# Patient Record
Sex: Male | Born: 1945 | Race: White | Hispanic: No | Marital: Married | Smoking: Never smoker
Health system: Southern US, Community
[De-identification: ages and names within clinical notes are randomized; demographics above are authoritative.]

## PROBLEM LIST (undated history)

## (undated) DIAGNOSIS — I10 Essential (primary) hypertension: Secondary | ICD-10-CM

## (undated) DIAGNOSIS — T753XXA Motion sickness, initial encounter: Secondary | ICD-10-CM

## (undated) HISTORY — DX: Essential (primary) hypertension: I10

---

## 2016-09-26 DIAGNOSIS — H2513 Age-related nuclear cataract, bilateral: Secondary | ICD-10-CM | POA: Diagnosis not present

## 2018-12-04 DIAGNOSIS — Z23 Encounter for immunization: Secondary | ICD-10-CM | POA: Diagnosis not present

## 2019-07-23 DIAGNOSIS — Z7189 Other specified counseling: Secondary | ICD-10-CM | POA: Diagnosis not present

## 2019-07-23 DIAGNOSIS — Z23 Encounter for immunization: Secondary | ICD-10-CM | POA: Diagnosis not present

## 2019-08-20 DIAGNOSIS — Z23 Encounter for immunization: Secondary | ICD-10-CM | POA: Diagnosis not present

## 2019-08-20 DIAGNOSIS — Z7189 Other specified counseling: Secondary | ICD-10-CM | POA: Diagnosis not present

## 2019-12-02 ENCOUNTER — Encounter: Payer: Self-pay | Admitting: Emergency Medicine

## 2019-12-02 ENCOUNTER — Ambulatory Visit
Admission: EM | Admit: 2019-12-02 | Discharge: 2019-12-02 | Disposition: A | Discharge status: Home / Self Care | Payer: Medicare Other | Attending: Physician Assistant | Admitting: Physician Assistant

## 2019-12-02 ENCOUNTER — Other Ambulatory Visit: Payer: Self-pay

## 2019-12-02 ENCOUNTER — Ambulatory Visit (INDEPENDENT_AMBULATORY_CARE_PROVIDER_SITE_OTHER): Payer: Medicare Other

## 2019-12-02 DIAGNOSIS — R05 Cough: Secondary | ICD-10-CM

## 2019-12-02 DIAGNOSIS — J189 Pneumonia, unspecified organism: Secondary | ICD-10-CM

## 2019-12-02 DIAGNOSIS — J1282 Pneumonia due to coronavirus disease 2019: Secondary | ICD-10-CM | POA: Insufficient documentation

## 2019-12-02 DIAGNOSIS — I10 Essential (primary) hypertension: Secondary | ICD-10-CM | POA: Diagnosis not present

## 2019-12-02 DIAGNOSIS — J90 Pleural effusion, not elsewhere classified: Secondary | ICD-10-CM | POA: Diagnosis not present

## 2019-12-02 DIAGNOSIS — R059 Cough, unspecified: Secondary | ICD-10-CM

## 2019-12-02 DIAGNOSIS — U071 COVID-19: Secondary | ICD-10-CM | POA: Diagnosis not present

## 2019-12-02 MED ORDER — AZITHROMYCIN 250 MG PO TABS: 250.0000 mg | ORAL_TABLET | Freq: Every day | ORAL | 0 refills | Status: DC

## 2019-12-02 MED ORDER — CEFDINIR 300 MG PO CAPS: 300.0000 mg | ORAL_CAPSULE | Freq: Two times a day (BID) | ORAL | 0 refills | Status: AC

## 2019-12-02 NOTE — Discharge Instructions (Signed)
Your x-ray shows likely pneumonia of the right lung. Begin antibiotics and complete both antibiotics, the full course. You are advised increase your fluid intake, rest and take Mucinex during the day. Follow-up with your PCP in about 3 weeks for repeat x-ray. Follow-up sooner if not feeling better. Go to the emergency room if you have fever, chest pain, breathing difficulty.  Blood pressure is also high and that is something you need to see your PCP about. Try to keep a blood pressure log so that you can follow-up and take medication if needed. If you do not have a PCP follow-up with [facility].  [facility] at [facility], [address], [address]. Phone number: [phone] Please call this number to establish with primary care   You have received COVID testing today either for positive exposure, concerning symptoms that could be related to COVID infection, screening purposes, or re-testing after confirmed positive.  Your test obtained today checks for active viral infection in the last 1-2 weeks. If your test is negative now, you can still test positive later. So, if you do develop symptoms you should either get re-tested and/or isolate x 10 days. Please follow CDC guidelines.  While Rapid antigen tests come back in 15-20 minutes, send out PCR/molecular test results typically come back within 24 hours. In the mean time, if you are symptomatic, assume this could be a positive test and treat/monitor yourself as if you do have COVID.   We will call with test results. Please download the MyChart app and set up a profile to access test results.   If symptomatic, go home and rest. Push fluids. Take Tylenol as needed for discomfort. Gargle warm salt water. Throat lozenges. Take Mucinex DM or Robitussin for cough. Humidifier in bedroom to ease coughing. Warm showers. Also review the COVID handout for more information.  COVID-19 INFECTION: The incubation period of COVID-19 is  approximately 14 days after exposure, with most symptoms developing in roughly 4-5 days. Symptoms may range in severity from mild to critically severe. Roughly 80% of those infected will have mild symptoms. People of any age may become infected with COVID-19 and have the ability to transmit the virus. The most common symptoms include: fever, fatigue, cough, body aches, headaches, sore throat, nasal congestion, shortness of breath, nausea, vomiting, diarrhea, changes in smell and/or taste.    COURSE OF ILLNESS Some patients may begin with mild disease which can progress quickly into critical symptoms. If your symptoms are worsening please call ahead to the Emergency Department and proceed there for further treatment. Recovery time appears to be roughly 1-2 weeks for mild symptoms and 3-6 weeks for severe disease.   GO IMMEDIATELY TO ER FOR FEVER YOU ARE UNABLE TO GET DOWN WITH TYLENOL, BREATHING PROBLEMS, CHEST PAIN, FATIGUE, LETHARGY, INABILITY TO EAT OR DRINK, ETC  QUARANTINE AND ISOLATION: To help decrease the spread of COVID-19 please remain isolated if you have COVID infection or are highly suspected to have COVID infection. This means -stay home and isolate to one room in the home if you live with others. Do not share a bed or bathroom with others while ill, sanitize and wipe down all countertops and keep common areas clean and disinfected. You may discontinue isolation if you have a mild case and are asymptomatic 10 days after symptom onset as long as you have been fever free >24 hours without having to take Motrin or Tylenol. If your case is more severe (meaning you develop pneumonia  or are admitted in the hospital), you may have to isolate longer.   If you have been in close contact (within 6 feet) of someone diagnosed with COVID 19, you are advised to quarantine in your home for 14 days as symptoms can develop anywhere from 2-14 days after exposure to the virus. If you develop symptoms, you  must  isolate.  Most current guidelines for COVID after exposure -isolate 10 days if you ARE NOT tested for COVID as long as symptoms do not develop -isolate 7 days if you are tested and remain asymptomatic -You do not necessarily need to be tested for COVID if you have + exposure and        develop   symptoms. Just isolate at home x10 days from symptom onset During this global pandemic, CDC advises to practice social distancing, try to stay at least 44ft away from others at all times. Wear a face covering. Wash and sanitize your hands regularly and avoid going anywhere that is not necessary.  KEEP IN MIND THAT THE COVID TEST IS NOT 100% ACCURATE AND YOU SHOULD STILL DO EVERYTHING TO PREVENT POTENTIAL SPREAD OF VIRUS TO OTHERS (WEAR MASK, WEAR GLOVES, WASH HANDS AND SANITIZE REGULARLY). IF INITIAL TEST IS NEGATIVE, THIS MAY NOT MEAN YOU ARE DEFINITELY NEGATIVE. MOST ACCURATE TESTING IS DONE 5-7 DAYS AFTER EXPOSURE.   It is not advised by CDC to get re-tested after receiving a positive COVID test since you can still test positive for weeks to months after you have already cleared the virus.   *If you have not been vaccinated for COVID, I strongly suggest you consider getting vaccinated as long as there are no contraindications.

## 2019-12-02 NOTE — ED Provider Notes (Signed)
[facility]    CSN: [id number] Arrival date & time: 12/02/19  1117      History   Chief Complaint Chief Complaint  Patient presents with  . Cough  . Otalgia    HPI Jack Kent is a 74 y.o. male.   Patient presents with wife for 3-week history of cough.  He is also complaining of ear pain and fullness of the right ear. Patient denies known Covid exposure.  He has been fully vaccinated for COVID-19.  He is feeling a little better over the past week or so, but the cough persists and is sometimes productive.  He denies any fevers, fatigue, chest pain, shortness of breath.  He denies abdominal pain, nausea, vomiting, diarrhea.  He says he has not been taking any medicine over-the-counter.  He says he is just been trying to let his body take care of it on its own.  He denies any medical problems.  No other concerns today.  HPI  History reviewed. No pertinent past medical history.  There are no problems to display for this patient.   History reviewed. No pertinent surgical history.     Home Medications    Prior to Admission medications   Medication Sig Start Date End Date Taking? Authorizing Provider  azithromycin (ZITHROMAX) 250 MG tablet Take 1 tablet (250 mg total) by mouth daily. Take first 2 tablets together, then 1 every day until finished. 12/02/19   Shirlee Latch, PA-C  cefdinir (OMNICEF) 300 MG capsule Take 1 capsule (300 mg total) by mouth 2 (two) times daily for 7 days. 12/02/19 12/09/19  Shirlee Latch, PA-C    Family History History reviewed. No pertinent family history.  Social History Social History   Tobacco Use  . Smoking status: Never Smoker  . Smokeless tobacco: Never Used  Vaping Use  . Vaping Use: Never used  Substance Use Topics  . Alcohol use: Not Currently  . Drug use: Never     Allergies   Declomycin [demeclocycline]   Review of Systems Review of Systems  Constitutional: Negative for fatigue and fever.  HENT: Positive  for congestion and ear pain. Negative for rhinorrhea, sinus pressure, sinus pain and sore throat.   Respiratory: Positive for cough. Negative for shortness of breath.   Cardiovascular: Negative for chest pain.  Gastrointestinal: Negative for abdominal pain, diarrhea, nausea and vomiting.  Musculoskeletal: Negative for myalgias.  Neurological: Negative for weakness, light-headedness and headaches.  Hematological: Negative for adenopathy.     Physical Exam Triage Vital Signs ED Triage Vitals  Enc Vitals Group     BP 12/02/19 1419 (!) 168/124     Pulse Rate 12/02/19 1419 97     Resp 12/02/19 1419 18     Temp 12/02/19 1419 98.6 F (37 C)     Temp Source 12/02/19 1419 Oral     SpO2 12/02/19 1419 100 %     Weight 12/02/19 1420 145 lb (65.8 kg)     Height 12/02/19 1420 5\' 4"  (1.626 m)     Head Circumference --      Peak Flow --      Pain Score 12/02/19 1420 0     Pain Loc --      Pain Edu? --      Excl. in GC? --    No data found.  Updated Vital Signs BP (!) 168/124 (BP Location: Right Arm)   Pulse 97   Temp 98.6 F (37 C) (Oral)   Resp  18   Ht 5\' 4"  (1.626 m)   Wt 145 lb (65.8 kg)   SpO2 100%   BMI 24.89 kg/m       Physical Exam Vitals and nursing note reviewed.  Constitutional:      General: He is not in acute distress.    Appearance: Normal appearance. He is well-developed and normal weight. He is not toxic-appearing.  HENT:     Head: Normocephalic and atraumatic.     Right Ear: Tympanic membrane, ear canal and external ear normal.     Left Ear: Tympanic membrane and ear canal normal.     Nose: Nose normal. No congestion or rhinorrhea.     Mouth/Throat:     Mouth: Mucous membranes are moist.     Pharynx: Oropharynx is clear.  Eyes:     General: No scleral icterus.    Conjunctiva/sclera: Conjunctivae normal.  Cardiovascular:     Rate and Rhythm: Normal rate and regular rhythm.     Heart sounds: No murmur heard.   Pulmonary:     Effort: Pulmonary effort is  normal. No respiratory distress.     Breath sounds: Normal breath sounds. No wheezing, rhonchi or rales.  Musculoskeletal:     Cervical back: Neck supple.  Skin:    General: Skin is warm and dry.  Neurological:     General: No focal deficit present.     Mental Status: He is alert. Mental status is at baseline.     Motor: No weakness.     Gait: Gait normal.  Psychiatric:        Mood and Affect: Mood normal.        Behavior: Behavior normal.        Thought Content: Thought content normal.      UC Treatments / Results  Labs (all labs ordered are listed, but only abnormal results are displayed) Labs Reviewed  SARS CORONAVIRUS 2 (TAT 6-24 HRS) - Abnormal; Notable for the following components:      Result Value   SARS Coronavirus 2 POSITIVE (*)    All other components within normal limits    EKG   Radiology DG Chest 2 View  Result Date: 12/02/2019 CLINICAL DATA:  Cough. EXAM: CHEST - 2 VIEW COMPARISON:  None. FINDINGS: The heart size and mediastinal contours are within normal limits. No pneumothorax or pleural effusion is noted. Minimal left midlung subsegmental atelectasis or scarring is noted. Faint opacity is seen in the right upper lobe which may represent atelectasis or infiltrate, but nodule cannot be excluded. The visualized skeletal structures are unremarkable. IMPRESSION: Faint right upper lobe opacity is noted which may represent atelectasis or infiltrate, but nodule cannot be excluded; CT scan of the chest is recommended for further evaluation. Minimal left midlung subsegmental atelectasis or scarring is noted. Electronically Signed   By: 12/04/2019 M.D.   On: 12/02/2019 15:28    Procedures Procedures (including critical care time)  Medications Ordered in UC Medications - No data to display  Initial Impression / Assessment and Plan / UC Course  I have reviewed the triage vital signs and the nursing notes.  Pertinent labs & imaging results that were available  during my care of the patient were reviewed by me and considered in my medical decision making (see chart for details).   74 year old male with persistent cough for 3 weeks presents today.  On exam his lungs do sound clear, but he is coughing in exam room.  BP elevated at 168/124.  He denies any history of high blood pressure and does not take anything for his blood pressure.  Chest x-ray shows questionable pneumonia of the right upper lobe.  Based on his symptoms treating for pneumonia at this time with cefdinir and azithromycin.  Covid test performed today and I discussed the CDC guidelines, isolation protocol and ED precautions with patient.  Advised him to start the antibiotics, take over-the-counter Coricidin HBP for cough and increased rest and fluid intake.  Patient is well-appearing on his exam, afebrile, not hypoxic, and suitable for at home care at this time, but I warned that if he does worsen in any way he should go to the emergency department or call EMS.  Advised patient to follow-up with PCP about his elevated blood pressure since they are significantly elevated.  Advised him to follow with our office as needed.  Final Clinical Impressions(s) / UC Diagnoses   Final diagnoses:  Pneumonia of right upper lobe due to infectious organism  Cough  Essential hypertension     Discharge Instructions     Your x-ray shows likely pneumonia of the right lung. Begin antibiotics and complete both antibiotics, the full course. You are advised increase your fluid intake, rest and take Mucinex during the day. Follow-up with your PCP in about 3 weeks for repeat x-ray. Follow-up sooner if not feeling better. Go to the emergency room if you have fever, chest pain, breathing difficulty.  Blood pressure is also high and that is something you need to see your PCP about. Try to keep a blood pressure log so that you can follow-up and take medication if needed. If you do not have a PCP follow-up with [facility].  [facility] [address] [address]. Phone number: [phone] Please call this number to establish with primary care   You have received COVID testing today either for positive exposure, concerning symptoms that could be related to COVID infection, screening purposes, or re-testing after confirmed positive.  Your test obtained today checks for active viral infection in the last 1-2 weeks. If your test is negative now, you can still test positive later. So, if you do develop symptoms you should either get re-tested and/or isolate x 10 days. Please follow CDC guidelines.  While Rapid antigen tests come back in 15-20 minutes, send out PCR/molecular test results typically come back within 24 hours. In the mean time, if you are symptomatic, assume this could be a positive test and treat/monitor yourself as if you do have COVID.   We will call with test results. Please download the MyChart app and set up a profile to access test results.   If symptomatic, go home and rest. Push fluids. Take Tylenol as needed for discomfort. Gargle warm salt water. Throat lozenges. Take Mucinex DM or Robitussin for cough. Humidifier in bedroom to ease coughing. Warm showers. Also review the COVID handout for more information.  COVID-19 INFECTION: The incubation period of COVID-19 is approximately 14 days after exposure, with most symptoms developing in roughly 4-5 days. Symptoms may range in severity from mild to critically severe. Roughly 80% of those infected will have mild symptoms. People of any age may become infected with COVID-19 and have the ability to transmit the virus. The most common symptoms include: fever, fatigue, cough, body aches, headaches, sore throat, nasal congestion, shortness of breath, nausea, vomiting, diarrhea, changes in smell and/or taste.    COURSE OF ILLNESS Some patients may begin with mild disease  which can progress quickly into critical  symptoms. If your symptoms are worsening please call ahead to the Emergency Department and proceed there for further treatment. Recovery time appears to be roughly 1-2 weeks for mild symptoms and 3-6 weeks for severe disease.   GO IMMEDIATELY TO ER FOR FEVER YOU ARE UNABLE TO GET DOWN WITH TYLENOL, BREATHING PROBLEMS, CHEST PAIN, FATIGUE, LETHARGY, INABILITY TO EAT OR DRINK, ETC  QUARANTINE AND ISOLATION: To help decrease the spread of COVID-19 please remain isolated if you have COVID infection or are highly suspected to have COVID infection. This means -stay home and isolate to one room in the home if you live with others. Do not share a bed or bathroom with others while ill, sanitize and wipe down all countertops and keep common areas clean and disinfected. You may discontinue isolation if you have a mild case and are asymptomatic 10 days after symptom onset as long as you have been fever free >24 hours without having to take Motrin or Tylenol. If your case is more severe (meaning you develop pneumonia or are admitted in the hospital), you may have to isolate longer.   If you have been in close contact (within 6 feet) of someone diagnosed with COVID 19, you are advised to quarantine in your home for 14 days as symptoms can develop anywhere from 2-14 days after exposure to the virus. If you develop symptoms, you  must isolate.  Most current guidelines for COVID after exposure -isolate 10 days if you ARE NOT tested for COVID as long as symptoms do not develop -isolate 7 days if you are tested and remain asymptomatic -You do not necessarily need to be tested for COVID if you have + exposure and        develop   symptoms. Just isolate at home x10 days from symptom onset During this global pandemic, CDC advises to practice social distancing, try to stay at least 37ft away from others at all times. Wear a face covering. Wash and sanitize your hands regularly and avoid going anywhere that is not  necessary.  KEEP IN MIND THAT THE COVID TEST IS NOT 100% ACCURATE AND YOU SHOULD STILL DO EVERYTHING TO PREVENT POTENTIAL SPREAD OF VIRUS TO OTHERS (WEAR MASK, WEAR GLOVES, WASH HANDS AND SANITIZE REGULARLY). IF INITIAL TEST IS NEGATIVE, THIS MAY NOT MEAN YOU ARE DEFINITELY NEGATIVE. MOST ACCURATE TESTING IS DONE 5-7 DAYS AFTER EXPOSURE.   It is not advised by CDC to get re-tested after receiving a positive COVID test since you can still test positive for weeks to months after you have already cleared the virus.   *If you have not been vaccinated for COVID, I strongly suggest you consider getting vaccinated as long as there are no contraindications.      ED Prescriptions    Medication Sig Dispense Auth. Provider   azithromycin (ZITHROMAX) 250 MG tablet Take 1 tablet (250 mg total) by mouth daily. Take first 2 tablets together, then 1 every day until finished. 6 tablet Eusebio Friendly B, PA-C   cefdinir (OMNICEF) 300 MG capsule Take 1 capsule (300 mg total) by mouth 2 (two) times daily for 7 days. 14 capsule Shirlee Latch, PA-C     PDMP not reviewed this encounter.   Shirlee Latch, PA-C 12/03/19 1359

## 2019-12-02 NOTE — ED Triage Notes (Signed)
Patient c/o cough x 3 weeks. He is also reporting right ear pain and fullness.

## 2019-12-03 LAB — SARS CORONAVIRUS 2 (TAT 6-24 HRS): SARS Coronavirus 2: POSITIVE — AB

## 2020-04-16 DIAGNOSIS — H2513 Age-related nuclear cataract, bilateral: Secondary | ICD-10-CM | POA: Diagnosis not present

## 2021-06-10 ENCOUNTER — Other Ambulatory Visit: Payer: Self-pay

## 2021-06-10 ENCOUNTER — Ambulatory Visit: Payer: Self-pay

## 2021-06-10 ENCOUNTER — Ambulatory Visit: Admission: EM | Admit: 2021-06-10 | Discharge: 2021-06-10 | Payer: Medicare Other

## 2021-06-10 DIAGNOSIS — R21 Rash and other nonspecific skin eruption: Secondary | ICD-10-CM

## 2021-06-10 DIAGNOSIS — M79661 Pain in right lower leg: Secondary | ICD-10-CM

## 2021-06-10 DIAGNOSIS — R9431 Abnormal electrocardiogram [ECG] [EKG]: Secondary | ICD-10-CM | POA: Diagnosis not present

## 2021-06-10 DIAGNOSIS — I10 Essential (primary) hypertension: Secondary | ICD-10-CM | POA: Diagnosis not present

## 2021-06-10 DIAGNOSIS — M7989 Other specified soft tissue disorders: Secondary | ICD-10-CM

## 2021-06-10 NOTE — ED Notes (Signed)
Patient is being discharged from the Urgent Care and sent to the Emergency Department via POV . Per Michiel Cowboy, [place], patient is in need of higher level of care due to further evaluation. Patient is aware and verbalizes understanding of plan of care.  ?Vitals:  ? 06/10/21 1325 06/10/21 1405  ?BP: (!) 171/110 (!) 175/98  ?Pulse: 85   ?Resp: 18   ?Temp: 98.6 ?F (37 ?C)   ?SpO2: 100%   ?  ?

## 2021-06-10 NOTE — ED Triage Notes (Signed)
Patient presents to Urgent Care with complaints of generalized body rash, bilateral shoulder pain, swelling to right hand and right lower leg x 2 weeks. 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 Treating symptoms with hydrocortisone and tylenol.  ? ?Denies fever, SOB, chest pain, numbness or tingling, no changes in medications, foods, or products.  ?

## 2021-06-10 NOTE — ED Provider Notes (Signed)
?[facility] ? ? ? ?CSN: [id number] ?Arrival date & time: 06/10/21  1248 ? ? ?  ? ?History   ?Chief Complaint ?Chief Complaint  ?Patient presents with  ? Rash  ? Leg Swelling  ? Shoulder Pain  ? ? ?HPI ?Jack Kent is a 76 y.o. male presenting with his daughter for multiple concerns.  First he states he has had a rash of his entire back for the past 2 weeks.  He says it is itchy but not painful.  Denies any known allergens.  Does not report any changes to detergents or body washes.  No similar symptoms in the past.  Has tried hydrocortisone cream without relief.  Believes the rash is getting worse. ? ?Additionally he reports right lower leg pain and swelling over the past couple of weeks.  Denies any injury.  Denies any similar process in the past.  No history of DVT or clotting disorder.  Additionally he reports right hand swelling and occasional pain.  No injury, skin color changes.  Patient does not report any history of autoimmune or rheumatological or arthritic conditions. ? ?Patient and daughter do state that he does not have a PCP and has not been seen in many years.  His blood pressure is elevated at 171/110 in clinic.  A recheck is 175/98.  Other vital signs are stable.  Patient denies any history of cardiopulmonary disease, liver disease, kidney disease.  However, they cannot remember the last time that he has had lab work done or had a physical.  He is denying any headaches, dizziness, chest pain, shortness of breath, increased abdominal swelling or pain.  No nausea or vomiting.  No fevers.  Not reporting any weight changes.  No other complaints. ? ? ?HPI ? ?History reviewed. No pertinent past medical history. ? ?There are no problems to display for this patient. ? ? ?History reviewed. No pertinent surgical history. ? ? ? ? ?Home Medications   ? ?Prior to Admission medications   ?Medication Sig Start Date End Date Taking? Authorizing Provider  ?azithromycin (ZITHROMAX) 250 MG tablet Take 1  tablet (250 mg total) by mouth daily. Take first 2 tablets together, then 1 every day until finished. 12/02/19   Shirlee Latch, PA-C  ? ? ?Family History ?History reviewed. No pertinent family history. ? ?Social History ?Social History  ? ?Tobacco Use  ? Smoking status: Never  ? Smokeless tobacco: Never  ?Vaping Use  ? Vaping Use: Never used  ?Substance Use Topics  ? Alcohol use: Not Currently  ? Drug use: Never  ? ? ? ?Allergies   ?Declomycin [demeclocycline] ? ? ?Review of Systems ?Review of Systems  ?Constitutional:  Negative for fatigue and fever.  ?Respiratory:  Negative for shortness of breath.   ?Cardiovascular:  Negative for chest pain.  ?Gastrointestinal:  Negative for abdominal pain, nausea and vomiting.  ?Musculoskeletal:  Positive for arthralgias and joint swelling. Negative for gait problem.  ?Skin:  Positive for rash. Negative for color change and wound.  ?Neurological:  Negative for dizziness, weakness, numbness and headaches.  ? ? ?Physical Exam ?Triage Vital Signs ?ED Triage Vitals  ?Enc Vitals Group  ?   BP 06/10/21 1325 (!) 171/110  ?   Pulse Rate 06/10/21 1325 85  ?   Resp 06/10/21 1325 18  ?   Temp 06/10/21 1325 98.6 ?F (37 ?C)  ?   Temp Source 06/10/21 1325 Oral  ?   SpO2 06/10/21 1325 100 %  ?  Weight --   ?   Height --   ?   Head Circumference --   ?   Peak Flow --   ?   Pain Score 06/10/21 1328 0  ?   Pain Loc --   ?   Pain Edu? --   ?   Excl. in GC? --   ? ?No data found. ? ?Updated Vital Signs ?BP (!) 175/98 (BP Location: Left Arm)   Pulse 85   Temp 98.6 ?F (37 ?C) (Oral)   Resp 18   SpO2 100%  ?   ? ?Physical Exam ?Vitals and nursing note reviewed.  ?Constitutional:   ?   General: He is not in acute distress. ?   Appearance: Normal appearance. He is well-developed. He is not ill-appearing.  ?HENT:  ?   Head: Normocephalic and atraumatic.  ?   Nose: Nose normal.  ?   Mouth/Throat:  ?   Mouth: Mucous membranes are moist.  ?   Pharynx: Oropharynx is clear.  ?Eyes:  ?   General: No  scleral icterus. ?   Conjunctiva/sclera: Conjunctivae normal.  ?Cardiovascular:  ?   Rate and Rhythm: Normal rate and regular rhythm.  ?   Heart sounds: Normal heart sounds.  ?Pulmonary:  ?   Effort: Pulmonary effort is normal. No respiratory distress.  ?   Breath sounds: Normal breath sounds.  ?Abdominal:  ?   General: There is distension.  ?   Palpations: Abdomen is soft.  ?   Tenderness: There is no abdominal tenderness.  ?Musculoskeletal:  ?   Cervical back: Neck supple.  ?   Comments: RIGHT LOWER LEG: Pitting edema up to knee.  There is swelling of the entire right foot up to the tibial tuberosity.  Mild increased warmth of calf with tenderness on squeezing.  No erythema or ecchymosis or skin discoloration.  No bony tenderness.  Full range of motion of the knee and ankle. ? ?RIGHT HAND: Diffuse swelling of the right hand.  No skin color changes, lacerations or abrasions.  No tenderness to palpation.  Good strength and sensation. ? ?Patient has no swelling of the left lower leg or left hand.  ?Skin: ?   General: Skin is warm and dry.  ?   Capillary Refill: Capillary refill takes less than 2 seconds.  ?   Findings: Rash (generalized scattered erythematous papules and tiny pustules of entire back) present.  ?Neurological:  ?   General: No focal deficit present.  ?   Mental Status: He is alert. Mental status is at baseline.  ?   Motor: No weakness.  ?   Coordination: Coordination normal.  ?   Gait: Gait normal.  ?Psychiatric:     ?   Mood and Affect: Mood normal.     ?   Behavior: Behavior normal.     ?   Thought Content: Thought content normal.  ? ? ? ?UC Treatments / Results  ?Labs ?(all labs ordered are listed, but only abnormal results are displayed) ?Labs Reviewed - No data to display ? ?EKG ? ? ?Radiology ?No results found. ? ?Procedures ?Procedures (including critical care time) ? ?Medications Ordered in UC ?Medications - No data to display ? ?Initial Impression / Assessment and Plan / UC Course  ?I have  reviewed the triage vital signs and the nursing notes. ? ?Pertinent labs & imaging results that were available during my care of the patient were reviewed by me and considered in my medical decision making (  see chart for details). ? ?76 year old male presenting with his daughter for multiple complaints including diffuse pruritic rash of back, right lower extremity swelling and right hand swelling for the past couple of weeks.  Does not report any injuries.  Additionally his blood pressure is elevated.  He does not take any blood pressure medication.  He does not have a PCP and has not seen one in many years.  Other vital signs are stable.  He is overall well-appearing.  Chest clear auscultation heart regular rate and rhythm.  On exam he does have noticeable swelling of the right hand without overlying erythema and no obvious signs of trauma or wounds.  Additionally he has increased swelling of the right lower leg from the knee down to the foot.  Edema is pitting.  Tenderness to palpation of his calf and increased warmth.  Calf of right side measures 51 cm and is 48 cm circumference on the left side. ? ?Had a long talk with patient and his daughter about working him up here versus the emergency department.  My concern is that he could have a potential DVT and not have follow-up since he does not have a PCP.  I also believe he could potentially have a DVT of his hand or may need to be worked up for autoimmune/rheumatological conditions.  Additionally he has significant swelling of his abdomen so there could be some underlying liver or kidney disease.  He definitely needs to have lab work done but depending on results may need to have close follow-up and we will be unable to provide that in urgent care setting.  Also explained that his blood pressure is very high and may need some medication to help bring that down along with refills of the medicine.  Patient's daughter is understanding.  They are going to [facility] emergency department at this time.  They are leaving in stable condition. ? ? ?Final Clinical Impressions(s) / UC Diagnoses  ? ?Final diagnoses:  ?Uncontrolled hypertension  ?Pain and swelling of right lower

## 2021-06-10 NOTE — Discharge Instructions (Signed)

## 2021-06-11 DIAGNOSIS — M79604 Pain in right leg: Secondary | ICD-10-CM | POA: Diagnosis not present

## 2021-06-11 DIAGNOSIS — Z881 Allergy status to other antibiotic agents status: Secondary | ICD-10-CM | POA: Diagnosis not present

## 2021-06-11 DIAGNOSIS — M7121 Synovial cyst of popliteal space [Baker], right knee: Secondary | ICD-10-CM | POA: Diagnosis not present

## 2021-06-11 DIAGNOSIS — I10 Essential (primary) hypertension: Secondary | ICD-10-CM | POA: Diagnosis not present

## 2021-06-11 DIAGNOSIS — R600 Localized edema: Secondary | ICD-10-CM | POA: Diagnosis not present

## 2021-06-11 DIAGNOSIS — M7989 Other specified soft tissue disorders: Secondary | ICD-10-CM | POA: Diagnosis not present

## 2021-06-11 DIAGNOSIS — Z79899 Other long term (current) drug therapy: Secondary | ICD-10-CM | POA: Diagnosis not present

## 2021-08-23 DIAGNOSIS — H2640 Unspecified secondary cataract: Secondary | ICD-10-CM | POA: Diagnosis not present

## 2021-09-06 ENCOUNTER — Other Ambulatory Visit: Payer: Self-pay | Admitting: Family Medicine

## 2021-09-06 ENCOUNTER — Ambulatory Visit
Admission: RE | Admit: 2021-09-06 | Discharge: 2021-09-06 | Disposition: A | Discharge status: Home / Self Care | Payer: Medicare Other | Attending: Family Medicine | Admitting: Family Medicine

## 2021-09-06 ENCOUNTER — Encounter: Payer: Self-pay | Admitting: Family Medicine

## 2021-09-06 ENCOUNTER — Ambulatory Visit (INDEPENDENT_AMBULATORY_CARE_PROVIDER_SITE_OTHER): Payer: Medicare Other | Admitting: Family Medicine

## 2021-09-06 ENCOUNTER — Ambulatory Visit
Admission: RE | Admit: 2021-09-06 | Discharge: 2021-09-06 | Disposition: A | Discharge status: Home / Self Care | Payer: Medicare Other | Source: Ambulatory Visit | Attending: Family Medicine | Admitting: Family Medicine

## 2021-09-06 VITALS — BP 122/78 | HR 76 | Ht 64.0 in | Wt 163.0 lb

## 2021-09-06 DIAGNOSIS — I10 Essential (primary) hypertension: Secondary | ICD-10-CM

## 2021-09-06 DIAGNOSIS — G8929 Other chronic pain: Secondary | ICD-10-CM | POA: Insufficient documentation

## 2021-09-06 DIAGNOSIS — M25512 Pain in left shoulder: Secondary | ICD-10-CM | POA: Insufficient documentation

## 2021-09-06 MED ORDER — AMLODIPINE BESYLATE 5 MG PO TABS: 5.0000 mg | ORAL_TABLET | Freq: Every day | ORAL | 1 refills | Status: DC

## 2021-09-06 MED ORDER — MELOXICAM 7.5 MG PO TABS: 7.5000 mg | ORAL_TABLET | Freq: Every day | ORAL | 0 refills | Status: DC

## 2021-09-06 NOTE — Progress Notes (Signed)
Date:  09/06/2021   Name:  Jack Kent   DOB:  October 03, 1945   MRN:  [id number]   Chief Complaint: Establish Care, Hypertension, and Shoulder Pain (Hurt shoulder picking up his mom- Tylenol not helping)  Hypertension This is a chronic problem. The current episode started more than 1 year ago. The problem has been gradually improving since onset. The problem is controlled. Pertinent negatives include no anxiety, blurred vision, chest pain, headaches, malaise/fatigue, neck pain, orthopnea, palpitations, peripheral edema, PND, shortness of breath or sweats. Past treatments include calcium channel blockers. The current treatment provides moderate improvement. There are no compliance problems.  There is no history of CAD/MI, CVA or heart failure. There is no history of chronic renal disease, a hypertension causing med or renovascular disease.  Shoulder Pain  The pain is present in the left shoulder. This is a chronic problem. The current episode started more than 1 month ago. The problem occurs intermittently. The problem has been waxing and waning. The quality of the pain is described as aching. Associated symptoms include a limited range of motion and stiffness. He has tried acetaminophen for the symptoms. The treatment provided mild relief.    No results found for: "NA", "K", "CO2", "GLUCOSE", "BUN", "CREATININE", "CALCIUM", "EGFR", "GFRNONAA" No results found for: "CHOL", "HDL", "LDLCALC", "LDLDIRECT", "TRIG", "CHOLHDL" No results found for: "TSH" No results found for: "HGBA1C" No results found for: "WBC", "HGB", "HCT", "MCV", "PLT" No results found for: "ALT", "AST", "GGT", "ALKPHOS", "BILITOT" No results found for: "25OHVITD2", "25OHVITD3", "VD25OH"   Review of Systems  Constitutional:  Negative for malaise/fatigue.  Eyes:  Negative for blurred vision.  Respiratory:  Negative for cough, shortness of breath and wheezing.   Cardiovascular:  Negative for chest pain, palpitations,  orthopnea, leg swelling and PND.  Musculoskeletal:  Positive for stiffness. Negative for neck pain.  Neurological:  Negative for headaches.    There are no problems to display for this patient.   Allergies  Allergen Reactions   Declomycin [Demeclocycline]     History reviewed. No pertinent surgical history.  Social History   Tobacco Use   Smoking status: Never   Smokeless tobacco: Never  Vaping Use   Vaping Use: Never used  Substance Use Topics   Alcohol use: Not Currently   Drug use: Never     Medication list has been reviewed and updated.  Current Meds  Medication Sig   amLODipine (NORVASC) 5 MG tablet Take 1 tablet by mouth daily.       09/06/2021    2:06 PM  GAD 7 : Generalized Anxiety Score  Nervous, Anxious, on Edge 0  Control/stop worrying 0  Worry too much - different things 0  Trouble relaxing 0  Restless 0  Easily annoyed or irritable 0  Afraid - awful might happen 0  Total GAD 7 Score 0  Anxiety Difficulty Not difficult at all       09/06/2021    2:06 PM  Depression screen PHQ 2/9  Decreased Interest 0  Down, Depressed, Hopeless 0  PHQ - 2 Score 0  Altered sleeping 0  Tired, decreased energy 0  Change in appetite 0  Feeling bad or failure about yourself  0  Trouble concentrating 0  Moving slowly or fidgety/restless 0  Suicidal thoughts 0  PHQ-9 Score 0  Difficult doing work/chores Not difficult at all    BP Readings from Last 3 Encounters:  09/06/21 122/78  06/10/21 (!) 175/98  12/02/19 (!) 168/124  Physical Exam Vitals and nursing note reviewed.  HENT:     Head: Normocephalic.     Right Ear: External ear normal.     Left Ear: External ear normal.     Nose: Nose normal.  Eyes:     General: No scleral icterus.       Right eye: No discharge.        Left eye: No discharge.     Conjunctiva/sclera: Conjunctivae normal.     Pupils: Pupils are equal, round, and reactive to light.  Neck:     Thyroid: No thyromegaly.      Vascular: No JVD.     Trachea: No tracheal deviation.  Cardiovascular:     Rate and Rhythm: Normal rate and regular rhythm.     Heart sounds: Normal heart sounds. No murmur heard.    No friction rub. No gallop.  Pulmonary:     Effort: No respiratory distress.     Breath sounds: Normal breath sounds. No wheezing or rales.  Abdominal:     General: Bowel sounds are normal.     Palpations: Abdomen is soft. There is no mass.     Tenderness: There is no abdominal tenderness. There is no guarding or rebound.  Musculoskeletal:        General: No tenderness.     Left shoulder: Decreased range of motion.     Cervical back: Normal range of motion and neck supple.  Lymphadenopathy:     Cervical: No cervical adenopathy.  Skin:    General: Skin is warm.     Findings: No rash.  Neurological:     Mental Status: He is alert and oriented to person, place, and time.     Cranial Nerves: No cranial nerve deficit.     Deep Tendon Reflexes: Reflexes are normal and symmetric.     Wt Readings from Last 3 Encounters:  09/06/21 163 lb (73.9 kg)  12/02/19 145 lb (65.8 kg)    BP 122/78   Pulse 76   Ht _0  (1.626 m)   Wt 163 lb (73.9 kg)   BMI 27.98 kg/m   Assessment and Plan: Patient is presenting to establish care with new physician.  Patient's chart was reviewed for previous encounters most recent labs most recent imaging in [place]. 1. Primary hypertension Chronic.  Controlled.  Stable.  Continue amlodipine 5 mg once a day. - amLODipine (NORVASC) 5 MG tablet; Take 1 tablet (5 mg total) by mouth daily.  Dispense: 90 tablet; Refill: 1  2. Chronic left shoulder pain Chronic.  Controlled.  Stable.  Patient may have had the initial injury 6 months ago when moving a concrete slab.  Patient has had 3 exacerbations with lifting of his mother who is "dead weight "when she has fallen.  Currently he is on acetaminophen with some relief but not resolution.  Examination notes there is tenderness  of the biceps and supraspinatus with limited range of motion particularly with abduction.  We will obtain an x-ray of the left shoulder and will refer to sports medicine for evaluation by Dr. Zigmund Daniel.  In the meantime we will initiate meloxicam 7.5 mg once a day that may be taken with this Tylenol to see if there is any resolution or improvement of the pain or range of motion. - DG Shoulder Left; Future - meloxicam (MOBIC) 7.5 MG tablet; Take 1 tablet (7.5 mg total) by mouth daily.  Dispense: 30 tablet; Refill: 0

## 2021-09-07 LAB — RENAL FUNCTION PANEL
Albumin: 4.7 g/dL (ref 3.7–4.7)
BUN/Creatinine Ratio: 15 (ref 10–24)
BUN: 18 mg/dL (ref 8–27)
CO2: 19 mmol/L — ABNORMAL LOW (ref 20–29)
Calcium: 9.5 mg/dL (ref 8.6–10.2)
Chloride: 104 mmol/L (ref 96–106)
Creatinine, Ser: 1.21 mg/dL (ref 0.76–1.27)
Glucose: 99 mg/dL (ref 70–99)
Phosphorus: 3.9 mg/dL (ref 2.8–4.1)
Potassium: 4.2 mmol/L (ref 3.5–5.2)
Sodium: 139 mmol/L (ref 134–144)
eGFR: 62 mL/min/{1.73_m2} (ref >59)

## 2021-09-22 ENCOUNTER — Encounter: Payer: Self-pay | Admitting: Family Medicine

## 2021-09-22 ENCOUNTER — Ambulatory Visit (INDEPENDENT_AMBULATORY_CARE_PROVIDER_SITE_OTHER): Payer: Medicare Other | Admitting: Family Medicine

## 2021-09-22 VITALS — BP 140/78 | HR 78 | Ht 64.0 in | Wt 167.0 lb

## 2021-09-22 DIAGNOSIS — M7522 Bicipital tendinitis, left shoulder: Secondary | ICD-10-CM | POA: Diagnosis not present

## 2021-09-22 DIAGNOSIS — M7582 Other shoulder lesions, left shoulder: Secondary | ICD-10-CM

## 2021-09-22 DIAGNOSIS — M7581 Other shoulder lesions, right shoulder: Secondary | ICD-10-CM | POA: Diagnosis not present

## 2021-09-22 NOTE — Assessment & Plan Note (Signed)
Right-hand-dominant male presenting with acute on chronic bilateral, left greater than right shoulder pain.  Left shoulder pain primarily anterior, secondarily laterally, radiation to the biceps, aggravated with ADLs, overhead, lifting activities.  Contralateral symptoms are primarily lateral and aggravated with overhead activities.  Examination reveals focality to the supraspinatus bilaterally without weakness, pain alone, tenderness at the bicipital groove on the left, subacromial space on the left, positive Neer's and Hawkins bilaterally, left greater than right, positive speeds and Yergason's on the left elbow, positive impingement testing bilateral.  Negative Spurling's test bilaterally.  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  We discussed treatment regimen seen inclusive of NSAIDs, ultrasound-guided corticosteroid injections however given patient's upcoming eye surgeries we will wait for input from ophthalmology (patient to contact) prior to scheduled NSAID dosing and/or corticosteroid injections.  Over the interim, he can dose diclofenac sparingly and we will coordinate follow-up accordingly.

## 2021-09-22 NOTE — Assessment & Plan Note (Signed)
See additional assessment(s) for plan details. 

## 2021-09-22 NOTE — Patient Instructions (Signed)
-   Touch base with your eye doctor about when a safe window would be to dose diclofenac 50 mg twice daily x14 days - Alternatively, when a safe window would be to proceed with a 1-2 cortisone (triamcinolone 40 mg) injection(s) at the left shoulder - Can dose diclofenac sparingly up to twice a day for severe pain - Contact us once you have the above information so that we can coordinate a follow-up visit

## 2021-09-22 NOTE — Progress Notes (Signed)
     Primary Care / Sports Medicine Office Visit  Patient Information:  Patient ID: Jack Kent, male DOB: 1945/11/26 Age: 76 y.o. MRN: [id number]   Jack Kent is a pleasant 76 y.o. male presenting with the following:  Chief Complaint  Patient presents with   Shoulder Pain    Left shoulder, for 3 months, no known injury, does have xray. Doesn't think the medication is helping. States comes and goes, worst at night while sleeping. States fingers are swollen from it.  Does complain of right shoulder as well pain for about 1 month, no known injury. Does have swelling in fingers.     Vitals:   09/22/21 1344  BP: 140/78  Pulse: 78  SpO2: 98%   Vitals:   09/22/21 1344  Weight: 167 lb (75.8 kg)  Height: 5\' 4"  (1.626 m)   Body mass index is 28.67 kg/m.  DG Shoulder Left  Result Date: 09/06/2021 CLINICAL DATA:  Pain EXAM: LEFT SHOULDER - 2+ VIEW COMPARISON:  None Available. FINDINGS: There is no evidence of fracture or dislocation. There is no evidence of arthropathy or other focal bone abnormality. Soft tissues are unremarkable. IMPRESSION: Negative. Electronically Signed   By: 09/08/2021 III M.D.   On: 09/06/2021 19:35     Independent interpretation of notes and tests performed by another provider:   Independent interpretation of left shoulder x-rays demonstrates cortical roughening at the superior humeral head, inferior aspect of the acromion consistent with mild degenerative changes, there is A/C joint mild degenerative changes noted, no acute osseous processes visualized  Procedures performed:   None  Pertinent History, Exam, Impression, and Recommendations:   Problem List Items Addressed This Visit       Musculoskeletal and Integument   Rotator cuff tendinitis, left - Primary    Right-hand-dominant male presenting with acute on chronic bilateral, left greater than right shoulder pain.  Left shoulder pain primarily anterior, secondarily laterally, radiation  to the biceps, aggravated with ADLs, overhead, lifting activities.  Contralateral symptoms are primarily lateral and aggravated with overhead activities.  Examination reveals focality to the supraspinatus bilaterally without weakness, pain alone, tenderness at the bicipital groove on the left, subacromial space on the left, positive Neer's and Hawkins bilaterally, left greater than right, positive speeds and Yergason's on the left elbow, positive impingement testing bilateral.  Negative Spurling's test bilaterally.  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  We discussed treatment regimen seen inclusive of NSAIDs, ultrasound-guided corticosteroid injections however given patient's upcoming eye surgeries we will wait for input from ophthalmology (patient to contact) prior to scheduled NSAID dosing and/or corticosteroid injections.  Over the interim, he can dose diclofenac sparingly and we will coordinate follow-up accordingly.      Biceps tendinitis of left shoulder    See additional assessment(s) for plan details.      Rotator cuff tendinitis, right    See additional assessment(s) for plan details.        Orders & Medications No orders of the defined types were placed in this encounter.  No orders of the defined types were placed in this encounter.    No follow-ups on file.     09/08/2021, MD   Primary Care Sports Medicine [facility]

## 2021-10-20 ENCOUNTER — Telehealth: Payer: Self-pay | Admitting: Family Medicine

## 2021-10-20 NOTE — Telephone Encounter (Signed)
Copied from CRM [id number]. Topic: Medicare AWV >> Oct 20, 2021 10:59 AM Zannie Kehr wrote: Reason for CRM:  Left message for patient to call back and schedule Medicare Annual Wellness Visit (AWV) in office.   If unable to come into the office for AWV,  please offer to do virtually or by telephone.  No hx of AWV eligible for AWVI per [employer] as of 07/19/2012  Please schedule at anytime with [facility] Health Advisor.      45 minute appointment   Any questions, please call me at [phone]

## 2021-10-25 ENCOUNTER — Ambulatory Visit (INDEPENDENT_AMBULATORY_CARE_PROVIDER_SITE_OTHER): Payer: Medicare Other

## 2021-10-25 DIAGNOSIS — Z0000 Encounter for general adult medical examination without abnormal findings: Secondary | ICD-10-CM | POA: Diagnosis not present

## 2021-10-25 NOTE — Progress Notes (Signed)
I connected with  [name] on 10/25/21 by a audio enabled telemedicine application and verified that I am speaking with the correct person using two identifiers.  Patient Location: Home  Provider Location: Office/Clinic  I discussed the limitations of evaluation and management by telemedicine. The patient expressed understanding and agreed to proceed.   Subjective:   Jack Kent is a 76 y.o. male who presents for Medicare Annual/Subsequent preventive examination.  Review of Systems    Per HPI unless specifically indicated below Cardiac Risk Factors include: advanced age (>66men, >72 women);male gender         Objective:    Today's Vitals   10/25/21 1514  PainSc: 2        09/22/2021    1:44 PM 09/06/2021    2:01 PM 06/10/2021    2:05 PM  Vitals with BMI  Height 5\' 4"  5\' 4"    Weight 167 lbs 163 lbs   BMI 28.65 27.97   Systolic 140 122  Diastolic 78 78 98  Pulse 78 76     There is no height or weight on file to calculate BMI.      No data to display          Current Medications (verified) Outpatient Encounter Medications as of 10/25/2021  Medication Sig   acetaminophen (TYLENOL) 500 MG tablet Take 500 mg by mouth every 6 (six) hours as needed.   amLODipine (NORVASC) 5 MG tablet Take 1 tablet (5 mg total) by mouth daily.   meloxicam (MOBIC) 7.5 MG tablet Take 1 tablet (7.5 mg total) by mouth daily. (Patient not taking: Reported on 10/25/2021)   No facility-administered encounter medications on file as of 10/25/2021.    Allergies (verified) Declomycin [demeclocycline]   History: Past Medical History:  Diagnosis Date   Hypertension    No past surgical history on file. No family history on file. Social History   Socioeconomic History   Marital status: Married    Spouse name: Not on file   Number of children: Not on file   Years of education: Not on file   Highest education level: Not on file  Occupational History   Not on file  Tobacco  Use   Smoking status: Never   Smokeless tobacco: Never  Vaping Use   Vaping Use: Never used  Substance and Sexual Activity   Alcohol use: Not Currently   Drug use: Never   Sexual activity: Not Currently  Other Topics Concern   Not on file  Social History Narrative   Not on file   Social Determinants of Health   Financial Resource Strain: Low Risk  (10/25/2021)   Overall Financial Resource Strain (CARDIA)    Difficulty of Paying Living Expenses: Not hard at all  Food Insecurity: No Food Insecurity (10/25/2021)   Hunger Vital Sign    Worried About Running Out of Food in the Last Year: Never true    Ran Out of Food in the Last Year: Never true  Transportation Needs: No Transportation Needs (10/25/2021)   PRAPARE - 12/25/2021 (Medical): No    Lack of Transportation (Non-Medical): No  Physical Activity: Unknown (10/25/2021)   Exercise Vital Sign    Days of Exercise per Week: Not on file    Minutes of Exercise per Session: 40 min  Stress: Stress Concern Present (10/25/2021)   12/25/2021 of Occupational Health - Occupational Stress Questionnaire    Feeling of Stress : To some  extent  Social Connections: Unknown (10/25/2021)   Social Connection and Isolation Panel [NHANES]    Frequency of Communication with Friends and Family: More than three times a week    Frequency of Social Gatherings with Friends and Family: More than three times a week    Attends Religious Services: Not on Marketing executive or Organizations: Yes    Attends Engineer, structural: More than 4 times per year    Marital Status: Married    Tobacco Counseling Counseling given: Not Answered   Clinical Intake:  Pre-visit preparation completed: No  Pain : No/denies pain Pain Score: 2  Pain Type: Acute pain Pain Location: Shoulder Pain Orientation: Left, Right     Nutritional Status: BMI of 19-24  Normal Nutritional Risks: None Diabetes: No  How often  do you need to have someone help you when you read instructions, pamphlets, or other written materials from your doctor or pharmacy?: 1 - Never  Diabetic? No  Interpreter Needed?: No  Information entered by :: Laurel Dimmer, CMA   Activities of Daily Living    10/25/2021    3:17 PM  In your present state of health, do you have any difficulty performing the following activities:  Hearing? 1  Comment bilateral hearing aid  Vision? 1  Difficulty concentrating or making decisions? 0  Walking or climbing stairs? 0  Dressing or bathing? 0  Doing errands, shopping? 0    Patient Care Team: Duanne Limerick, MD as PCP - General (Family Medicine)  Indicate any recent Medical Services you may have received from other than [facility] providers in the past year (date may be approximate).    No hospitalization in the past 12 months. Assessment:   This is a routine wellness examination for [place].  Hearing/Vision screen Denies any hearing issues. Annual [facility] Ex[facility]. Wear glasses, cataract surgery scheduled 12/07/2021 Dietary issues and exercise activities discussed: Current Exercise Habits: The patient has a physically strenuous job, but has no regular exercise apart from work., Exercise limited by: orthopedic condition(s)   Goals Addressed             This Visit's Progress    stay active       Stay active and independent        Depression Screen    10/25/2021    3:18 PM 09/22/2021    1:47 PM 09/06/2021    2:06 PM  PHQ 2/9 Scores  PHQ - 2 Score 0 0 0  PHQ- 9 Score  0 0    Fall Risk    10/25/2021    3:17 PM 09/22/2021    1:48 PM 09/06/2021    2:06 PM  Fall Risk   Falls in the past year? 0 0 0  Number falls in past yr: 0  0  Injury with Fall? 0 0 0  Risk for fall due to : No Fall Risks  No Fall Risks  Follow up Falls evaluation completed  Falls evaluation completed    FALL RISK PREVENTION PERTAINING TO THE HOME:  Any stairs in or around the home? Yes   If so, are there any without handrails? Yes  Home free of loose throw rugs in walkways, pet beds, electrical cords, etc? Yes  Adequate lighting in your home to reduce risk of falls? Yes   ASSISTIVE DEVICES UTILIZED TO PREVENT FALLS:  Life alert? No  Use of a cane, walker or w/c? No  Grab  bars in the bathroom? No  Shower chair or bench in shower? Yes  Elevated toilet seat or a handicapped toilet? No   TIMED UP AND GO:  Was the test performed?  no virtual appointment  .    Cognitive Function:        10/25/2021    3:18 PM  6CIT Screen  What Year? 0 points  What month? 0 points  What time? 0 points  Count back from 20 0 points  Months in reverse 0 points  Repeat phrase 0 points  Total Score 0 points    Immunizations  There is no immunization history on file for this patient.  TDAP status: Due, Education has been provided regarding the importance of this vaccine. Advised may receive this vaccine at local pharmacy or Health Dept. Aware to provide a copy of the vaccination record if obtained from local pharmacy or Health Dept. Verbalized acceptance and understanding.  Flu Vaccine status: Completed at today's visit  Pneumococcal vaccine status: Declined,  Education has been provided regarding the importance of this vaccine but patient still declined. Advised may receive this vaccine at local pharmacy or Health Dept. Aware to provide a copy of the vaccination record if obtained from local pharmacy or Health Dept. Verbalized acceptance and understanding.   Covid-19 vaccine status: Declined, Education has been provided regarding the importance of this vaccine but patient still declined. Advised may receive this vaccine at local pharmacy or Health Dept.or vaccine clinic. Aware to provide a copy of the vaccination record if obtained from local pharmacy or Health Dept. Verbalized acceptance and understanding.  Qualifies for Shingles Vaccine? Yes   Zostavax completed No   Shingrix  Completed?: No.    Education has been provided regarding the importance of this vaccine. Patient has been advised to call insurance company to determine out of pocket expense if they have not yet received this vaccine. Advised may also receive vaccine at local pharmacy or Health Dept. Verbalized acceptance and understanding.  Screening Tests Health Maintenance  Topic Date Due   COVID-19 Vaccine (1) Never done   INFLUENZA VACCINE  10/19/2021   Zoster Vaccines- Shingrix (1 of 2) 12/07/2021 (Originally 07/25/1964)   Pneumonia Vaccine 71+ Years old (1 - PCV) 09/07/2022 (Originally 07/26/2011)   COLONOSCOPY (Pts 45-83yrs Insurance coverage will need to be confirmed)  09/07/2022 (Originally 07/26/1991)   TETANUS/TDAP  09/07/2022 (Originally 07/25/1964)   Hepatitis C Screening  09/07/2022 (Originally 07/25/1963)   HPV VACCINES  Aged Out    Health Maintenance  Health Maintenance Due  Topic Date Due   COVID-19 Vaccine (1) Never done   INFLUENZA VACCINE  10/19/2021    Colorectal Screening: Pt declined   Lung Cancer Screening: (Low Dose CT Chest recommended if Age 28-80 years, 30 pack-year currently smoking OR have quit w/in 15years.) does not qualify.   Lung Cancer Screening Referral: does not qualify   Additional Screening:  Hepatitis C Screening: does not qualify  Vision Screening: Recommended annual ophthalmology exams for early detection of glaucoma and other disorders of the eye. Is the patient up to date with their annual eye exam?  Yes  Who is the provider or what is the name of the office in which the patient attends annual eye exams? [facility]  If pt is not established with a provider, would they like to be referred to a provider to establish care? No .   Dental Screening: Recommended annual dental exams for proper oral hygiene  Community Resource Referral / Chronic  Care Management: CRR required this visit?  No   CCM required this visit?  No      Plan:     I have  personally reviewed and noted the following in the patient's chart:   Medical and social history Use of alcohol, tobacco or illicit drugs  Current medications and supplements including opioid prescriptions. Patient is not currently taking opioid prescriptions. Functional ability and status Nutritional status Physical activity Advanced directives List of other physicians Hospitalizations, surgeries, and ER visits in previous 12 months Vitals Screenings to include cognitive, depression, and falls Referrals and appointments  In addition, I have reviewed and discussed with patient certain preventive protocols, quality metrics, and best practice recommendations. A written personalized care plan for preventive services as well as general preventive health recommendations were provided to patient.    Mr. Brunsman , Thank you for taking time to come for your Medicare Wellness Visit. I appreciate your ongoing commitment to your health goals. Please review the following plan we discussed and let me know if I can assist you in the future.   These are the goals we discussed:  Goals      stay active     Stay active and independent         This is a list of the screening recommended for you and due dates:  Health Maintenance  Topic Date Due   COVID-19 Vaccine (1) Never done   Flu Shot  10/19/2021   Zoster (Shingles) Vaccine (1 of 2) 12/07/2021*   Pneumonia Vaccine (1 - PCV) 09/07/2022*   Colon Cancer Screening  09/07/2022*   Tetanus Vaccine  09/07/2022*   Hepatitis C Screening: USPSTF Recommendation to screen - Ages 18-79 yo.  09/07/2022*   HPV Vaccine  Aged Out  *Topic was postponed. The date shown is not the original due date.      Lonna Cobb, CMA   10/25/2021   Nurse Notes:non-face-to-face medicare wellness visit

## 2021-10-25 NOTE — Patient Instructions (Signed)
Health Maintenance, Male Adopting a healthy lifestyle and getting preventive care are important in promoting health and wellness. Ask your health care provider about: The right schedule for you to have regular tests and exams. Things you can do on your own to prevent diseases and keep yourself healthy. What should I know about diet, weight, and exercise? Eat a healthy diet  Eat a diet that includes plenty of vegetables, fruits, low-fat dairy products, and lean protein. Do not eat a lot of foods that are high in solid fats, added sugars, or sodium. Maintain a healthy weight Body mass index (BMI) is a measurement that can be used to identify possible weight problems. It estimates body fat based on height and weight. Your health care provider can help determine your BMI and help you achieve or maintain a healthy weight. Get regular exercise Get regular exercise. This is one of the most important things you can do for your health. Most adults should: Exercise for at least 150 minutes each week. The exercise should increase your heart rate and make you sweat (moderate-intensity exercise). Do strengthening exercises at least twice a week. This is in addition to the moderate-intensity exercise. Spend less time sitting. Even light physical activity can be beneficial. Watch cholesterol and blood lipids Have your blood tested for lipids and cholesterol at 75 years of age, then have this test every 5 years. You may need to have your cholesterol levels checked more often if: Your lipid or cholesterol levels are high. You are older than 75 years of age. You are at high risk for heart disease. What should I know about cancer screening? Many types of cancers can be detected early and may often be prevented. Depending on your health history and family history, you may need to have cancer screening at various ages. This may include screening for: Colorectal cancer. Prostate cancer. Skin cancer. Lung  cancer. What should I know about heart disease, diabetes, and high blood pressure? Blood pressure and heart disease High blood pressure causes heart disease and increases the risk of stroke. This is more likely to develop in people who have high blood pressure readings or are overweight. Talk with your health care provider about your target blood pressure readings. Have your blood pressure checked: Every 3-5 years if you are 18-39 years of age. Every year if you are 40 years old or older. If you are between the ages of 65 and 75 and are a current or former smoker, ask your health care provider if you should have a one-time screening for abdominal aortic aneurysm (AAA). Diabetes Have regular diabetes screenings. This checks your fasting blood sugar level. Have the screening done: Once every three years after age 45 if you are at a normal weight and have a low risk for diabetes. More often and at a younger age if you are overweight or have a high risk for diabetes. What should I know about preventing infection? Hepatitis B If you have a higher risk for hepatitis B, you should be screened for this virus. Talk with your health care provider to find out if you are at risk for hepatitis B infection. Hepatitis C Blood testing is recommended for: Everyone born from 1945 through 1965. Anyone with known risk factors for hepatitis C. Sexually transmitted infections (STIs) You should be screened each year for STIs, including gonorrhea and chlamydia, if: You are sexually active and are younger than 75 years of age. You are older than 75 years of age and your   health care provider tells you that you are at risk for this type of infection. Your sexual activity has changed since you were last screened, and you are at increased risk for chlamydia or gonorrhea. Ask your health care provider if you are at risk. Ask your health care provider about whether you are at high risk for HIV. Your health care provider  may recommend a prescription medicine to help prevent HIV infection. If you choose to take medicine to prevent HIV, you should first get tested for HIV. You should then be tested every 3 months for as long as you are taking the medicine. Follow these instructions at home: Alcohol use Do not drink alcohol if your health care provider tells you not to drink. If you drink alcohol: Limit how much you have to 0-2 drinks a day. Know how much alcohol is in your drink. In the [place] one drink equals one 12 oz bottle of beer (355 mL), one 5 oz glass of wine (148 mL), or one 1 oz glass of hard liquor (44 mL). Lifestyle Do not use any products that contain nicotine or tobacco. These products include cigarettes, chewing tobacco, and vaping devices, such as e-cigarettes. If you need help quitting, ask your health care provider. Do not use street drugs. Do not share needles. Ask your health care provider for help if you need support or information about quitting drugs. General instructions Schedule regular health, dental, and eye exams. Stay current with your vaccines. Tell your health care provider if: You often feel depressed. You have ever been abused or do not feel safe at home. Summary Adopting a healthy lifestyle and getting preventive care are important in promoting health and wellness. Follow your health care provider's instructions about healthy diet, exercising, and getting tested or screened for diseases. Follow your health care provider's instructions on monitoring your cholesterol and blood pressure. This information is not intended to replace advice given to you by your health care provider. Make sure you discuss any questions you have with your health care provider. Document Revised: 07/27/2020 Document Reviewed: 07/27/2020 Elsevier Patient Education  2023 Elsevier Inc.  

## 2021-11-08 DIAGNOSIS — H2511 Age-related nuclear cataract, right eye: Secondary | ICD-10-CM | POA: Diagnosis not present

## 2021-11-11 ENCOUNTER — Encounter: Payer: Self-pay | Admitting: Ophthalmology

## 2021-11-17 NOTE — Discharge Instructions (Signed)

## 2021-11-23 ENCOUNTER — Encounter: Payer: Self-pay | Admitting: Ophthalmology

## 2021-11-23 ENCOUNTER — Ambulatory Visit: Payer: Medicare Other | Admitting: Anesthesiology

## 2021-11-23 ENCOUNTER — Ambulatory Visit (AMBULATORY_SURGERY_CENTER): Payer: Medicare Other | Admitting: Anesthesiology

## 2021-11-23 ENCOUNTER — Ambulatory Visit
Admission: RE | Admit: 2021-11-23 | Discharge: 2021-11-23 | Disposition: A | Discharge status: Home / Self Care | Payer: Medicare Other | Source: Ambulatory Visit | Attending: Ophthalmology | Admitting: Ophthalmology

## 2021-11-23 ENCOUNTER — Encounter
Admission: RE | Disposition: A | Discharge status: Home / Self Care | Payer: Self-pay | Source: Ambulatory Visit | Attending: Ophthalmology

## 2021-11-23 ENCOUNTER — Other Ambulatory Visit: Payer: Self-pay

## 2021-11-23 DIAGNOSIS — I10 Essential (primary) hypertension: Secondary | ICD-10-CM | POA: Insufficient documentation

## 2021-11-23 DIAGNOSIS — Z87891 Personal history of nicotine dependence: Secondary | ICD-10-CM | POA: Diagnosis not present

## 2021-11-23 DIAGNOSIS — H2511 Age-related nuclear cataract, right eye: Secondary | ICD-10-CM | POA: Diagnosis not present

## 2021-11-23 DIAGNOSIS — T753XXA Motion sickness, initial encounter: Secondary | ICD-10-CM | POA: Diagnosis not present

## 2021-11-23 HISTORY — PX: CATARACT EXTRACTION W/PHACO: SHX586

## 2021-11-23 HISTORY — DX: Motion sickness, initial encounter: T75.3XXA

## 2021-11-23 SURGERY — PHACOEMULSIFICATION, CATARACT, WITH IOL INSERTION
Anesthesia: Monitor Anesthesia Care | Site: Eye | Laterality: Right

## 2021-11-23 MED ORDER — SIGHTPATH DOSE#1 BSS IO SOLN
INTRAOCULAR | Status: DC | PRN
  Administered 2021-11-23: 15 mL

## 2021-11-23 MED ORDER — MOXIFLOXACIN HCL 0.5 % OP SOLN
OPHTHALMIC | Status: DC | PRN
  Administered 2021-11-23: 0.2 mL via OPHTHALMIC

## 2021-11-23 MED ORDER — BRIMONIDINE TARTRATE-TIMOLOL 0.2-0.5 % OP SOLN
OPHTHALMIC | Status: DC | PRN
  Administered 2021-11-23: 1 [drp] via OPHTHALMIC

## 2021-11-23 MED ORDER — SIGHTPATH DOSE#1 NA CHONDROIT SULF-NA HYALURON 40-17 MG/ML IO SOLN
INTRAOCULAR | Status: DC | PRN
  Administered 2021-11-23: 1 mL via INTRAOCULAR

## 2021-11-23 MED ORDER — SIGHTPATH DOSE#1 BSS IO SOLN
INTRAOCULAR | Status: DC | PRN
  Administered 2021-11-23: 98 mL via OPHTHALMIC

## 2021-11-23 MED ORDER — SIGHTPATH DOSE#1 BSS IO SOLN
INTRAOCULAR | Status: DC | PRN
  Administered 2021-11-23: 1 mL

## 2021-11-23 MED ORDER — TETRACAINE HCL 0.5 % OP SOLN
1.0000 [drp] | OPHTHALMIC | Status: DC | PRN
  Administered 2021-11-23 (×3): 1 [drp] via OPHTHALMIC

## 2021-11-23 MED ORDER — LACTATED RINGERS IV SOLN: INTRAVENOUS | Status: DC

## 2021-11-23 MED ORDER — MIDAZOLAM HCL 2 MG/2ML IJ SOLN
INTRAMUSCULAR | Status: DC | PRN
  Administered 2021-11-23: 2 mg via INTRAVENOUS

## 2021-11-23 MED ORDER — ARMC OPHTHALMIC DILATING DROPS
1.0000 | OPHTHALMIC | Status: DC | PRN
  Administered 2021-11-23 (×3): 1 via OPHTHALMIC

## 2021-11-23 SURGICAL SUPPLY — 16 items
CANNULA ANT/CHMB 27G (MISCELLANEOUS) IMPLANT
CANNULA ANT/CHMB 27GA (MISCELLANEOUS) IMPLANT
CATARACT SUITE SIGHTPATH (MISCELLANEOUS) ×1 IMPLANT
FEE CATARACT SUITE SIGHTPATH (MISCELLANEOUS) ×1 IMPLANT
GLOVE SURG ENC TEXT LTX SZ8 (GLOVE) ×1 IMPLANT
GLOVE SURG TRIUMPH 8.0 PF LTX (GLOVE) ×1 IMPLANT
LENS IOL TECNIS EYHANCE 22.5 (Intraocular Lens) IMPLANT
NDL FILTER BLUNT 18X1 1/2 (NEEDLE) ×1 IMPLANT
NEEDLE FILTER BLUNT 18X 1/2SAF (NEEDLE) ×1
NEEDLE FILTER BLUNT 18X1 1/2 (NEEDLE) ×1 IMPLANT
PACK VIT ANT 23G (MISCELLANEOUS) IMPLANT
RING MALYGIN (MISCELLANEOUS) IMPLANT
SUT ETHILON 10-0 CS-B-6CS-B-6 (SUTURE)
SUTURE EHLN 10-0 CS-B-6CS-B-6 (SUTURE) IMPLANT
SYR 3ML LL SCALE MARK (SYRINGE) ×1 IMPLANT
WATER STERILE IRR 250ML POUR (IV SOLUTION) ×1 IMPLANT

## 2021-11-23 NOTE — Anesthesia Preprocedure Evaluation (Signed)
Anesthesia Evaluation  Patient identified by MRN, date of birth, ID band Patient awake    Reviewed: Allergy & Precautions, H&P , NPO status , Patient's Chart, lab work & pertinent test results, reviewed documented beta blocker date and time   History of Anesthesia Complications Negative for: history of anesthetic complications  Airway Mallampati: II  TM Distance: >3 FB Neck ROM: full    Dental  (+) Dental Advidsory Given   Pulmonary neg pulmonary ROS, former smoker,    Pulmonary exam normal breath sounds clear to auscultation       Cardiovascular Exercise Tolerance: Good hypertension, (-) angina(-) Past MI and (-) Cardiac Stents Normal cardiovascular exam(-) dysrhythmias (-) Valvular Problems/Murmurs Rhythm:regular Rate:Normal     Neuro/Psych negative neurological ROS  negative psych ROS   GI/Hepatic negative GI ROS, Neg liver ROS,   Endo/Other  negative endocrine ROS  Renal/GU negative Renal ROS  negative genitourinary   Musculoskeletal   Abdominal   Peds  Hematology negative hematology ROS (+)   Anesthesia Other Findings Past Medical History: No date: Hypertension No date: Motion sickness     Comment:  Boats   Reproductive/Obstetrics negative OB ROS                             Anesthesia Physical Anesthesia Plan  ASA: 2  Anesthesia Plan: MAC   Post-op Pain Management:    Induction: Intravenous  PONV Risk Score and Plan: 1 and Midazolam and Treatment may vary due to age or medical condition  Airway Management Planned: Natural Airway and Nasal Cannula  Additional Equipment:   Intra-op Plan:   Post-operative Plan:   Informed Consent: I have reviewed the patients History and Physical, chart, labs and discussed the procedure including the risks, benefits and alternatives for the proposed anesthesia with the patient or authorized representative who has indicated his/her  understanding and acceptance.     Dental Advisory Given  Plan Discussed with: Anesthesiologist, CRNA and Surgeon  Anesthesia Plan Comments:         Anesthesia Quick Evaluation

## 2021-11-23 NOTE — Anesthesia Postprocedure Evaluation (Signed)
Anesthesia Post Note  Patient: Jack Kent  Procedure(s) Performed: CATARACT EXTRACTION PHACO AND INTRAOCULAR LENS PLACEMENT (IOC) RIGHT (Right: Eye)     Patient location during evaluation: PACU Anesthesia Type: MAC Level of consciousness: awake and alert Pain management: pain level controlled Vital Signs Assessment: post-procedure vital signs reviewed and stable Respiratory status: spontaneous breathing, nonlabored ventilation, respiratory function stable and patient connected to nasal cannula oxygen Cardiovascular status: stable and blood pressure returned to baseline Postop Assessment: no apparent nausea or vomiting Anesthetic complications: no   No notable events documented.  Martha Clan

## 2021-11-23 NOTE — H&P (Signed)
[facility]   Primary Care Physician:  Jack Limerick, MD Ophthalmologist: Dr. Druscilla Brownie Pre-Procedure History & Physical: HPI:  Jack Kent is a 76 y.o. male here for cataract surgery.   Past Medical History:  Diagnosis Date   Hypertension    Motion sickness    Boats    History reviewed. No pertinent surgical history.  Prior to Admission medications   Medication Sig Start Date End Date Taking? Authorizing Provider  acetaminophen (TYLENOL) 500 MG tablet Take 500 mg by mouth every 6 (six) hours as needed.   Yes [provider]  amLODipine (NORVASC) 5 MG tablet Take 1 tablet (5 mg total) by mouth daily. 09/06/21  Yes Jack Limerick, MD  meloxicam (MOBIC) 7.5 MG tablet Take 1 tablet (7.5 mg total) by mouth daily. Patient not taking: Reported on 10/25/2021 09/06/21   Jack Limerick, MD    Allergies as of 09/24/2021 - Review Complete 09/22/2021  Allergen Reaction Noted   Declomycin [demeclocycline]  12/02/2019    History reviewed. No pertinent family history.  Social History   Socioeconomic History   Marital status: Married    Spouse name: Not on file   Number of children: Not on file   Years of education: Not on file   Highest education level: Not on file  Occupational History   Not on file  Tobacco Use   Smoking status: Former    Types: Cigarettes   Smokeless tobacco: Former   Tobacco comments:    Smoked and chewed some in [employer]  Vaping Use   Vaping Use: Never used  Substance and Sexual Activity   Alcohol use: Not Currently   Drug use: Never   Sexual activity: Not Currently  Other Topics Concern   Not on file  Social History Narrative   Not on file   Social Determinants of Health   Financial Resource Strain: Low Risk  (10/25/2021)   Overall Financial Resource Strain (CARDIA)    Difficulty of Paying Living Expenses: Not hard at all  Food Insecurity: No Food Insecurity (10/25/2021)   Hunger Vital Sign    Worried About Running Out of  Food in the Last Year: Never true    Ran Out of Food in the Last Year: Never true  Transportation Needs: No Transportation Needs (10/25/2021)   PRAPARE - Administrator, Civil Service (Medical): No    Lack of Transportation (Non-Medical): No  Physical Activity: Unknown (10/25/2021)   Exercise Vital Sign    Days of Exercise per Week: Not on file    Minutes of Exercise per Session: 40 min  Stress: Stress Concern Present (10/25/2021)   [employer] of Occupational Health - Occupational Stress Questionnaire    Feeling of Stress : To some extent  Social Connections: Unknown (10/25/2021)   Social Connection and Isolation Panel [NHANES]    Frequency of Communication with Friends and Family: More than three times a week    Frequency of Social Gatherings with Friends and Family: More than three times a week    Attends Religious Services: Not on file    Active Member of Clubs or Organizations: Yes    Attends Banker Meetings: More than 4 times per year    Marital Status: Married  Catering manager Violence: Not At Risk (10/25/2021)   Humiliation, Afraid, Rape, and Kick questionnaire    Fear of Current or Ex-Partner: No    Emotionally Abused: No    Physically Abused: No  Sexually Abused: No    Review of Systems: See HPI, otherwise negative ROS  Physical Exam: BP (!) 191/91   Pulse 70   Temp (!) 97.5 F (36.4 C) (Temporal)   Ht 5\' 3"  (1.6 m)   Wt 72.4 kg   SpO2 100%   BMI 28.27 kg/m  General:   Alert, cooperative in NAD Head:  Normocephalic and atraumatic. Respiratory:  Normal work of breathing. Cardiovascular:  RRR  Impression/Plan: Jack Kent is here for cataract surgery.  Risks, benefits, limitations, and alternatives regarding cataract surgery have been reviewed with the patient.  Questions have been answered.  All parties agreeable.   [name], MD  11/23/2021, 12:30 PM

## 2021-11-23 NOTE — Op Note (Signed)
PREOPERATIVE DIAGNOSIS:  Nuclear sclerotic cataract of the right eye.   POSTOPERATIVE DIAGNOSIS:  Cataract   OPERATIVE PROCEDURE:ORPROCALL@   SURGEON:  Galen Manila, MD.   ANESTHESIA:  Anesthesiologist: Lenard Simmer, MD CRNA: Domenic Moras, CRNA  1.      Managed anesthesia care. 2.      0.45ml of Shugarcaine was instilled in the eye following the paracentesis.   COMPLICATIONS:  None.   TECHNIQUE:   Stop and chop   DESCRIPTION OF PROCEDURE:  The patient was examined and consented in the preoperative holding area where the aforementioned topical anesthesia was applied to the right eye and then brought back to the Operating Room where the right eye was prepped and draped in the usual sterile ophthalmic fashion and a lid speculum was placed. A paracentesis was created with the side port blade and the anterior chamber was filled with viscoelastic. A near clear corneal incision was performed with the steel keratome. A continuous curvilinear capsulorrhexis was performed with a cystotome followed by the capsulorrhexis forceps. Hydrodissection and hydrodelineation were carried out with BSS on a blunt cannula. The lens was removed in a stop and chop  technique and the remaining cortical material was removed with the irrigation-aspiration handpiece. The capsular bag was inflated with viscoelastic and the Technis ZCB00  lens was placed in the capsular bag without complication. The remaining viscoelastic was removed from the eye with the irrigation-aspiration handpiece. The wounds were hydrated. The anterior chamber was flushed with BSS and the eye was inflated to physiologic pressure. 0.40ml of Vigamox was placed in the anterior chamber. The wounds were found to be water tight. The eye was dressed with Combigan. The patient was given protective glasses to wear throughout the day and a shield with which to sleep tonight. The patient was also given drops with which to begin a drop regimen today and  will follow-up with me in one day. Implant Name Type Inv. Item Serial No. Manufacturer Lot No. LRB No. Used Action  LENS IOL TECNIS EYHANCE 22.5 - [id number] Intraocular Lens LENS IOL TECNIS EYHANCE 22.5 [id number] [employer]  Right 1 Implanted   Procedure(s) with comments: CATARACT EXTRACTION PHACO AND INTRAOCULAR LENS PLACEMENT (IOC) RIGHT (Right) - 15.23 1:27.4  Electronically signed: Galen Manila 11/23/2021 12:58 PM

## 2021-11-23 NOTE — Transfer of Care (Signed)
Immediate Anesthesia Transfer of Care Note  Patient: Jack Kent  Procedure(s) Performed: CATARACT EXTRACTION PHACO AND INTRAOCULAR LENS PLACEMENT (IOC) RIGHT (Right: Eye)  Patient Location: PACU  Anesthesia Type: MAC  Level of Consciousness: awake, alert  and patient cooperative  Airway and Oxygen Therapy: Patient Spontanous Breathing and Patient connected to supplemental oxygen  Post-op Assessment: Post-op Vital signs reviewed, Patient's Cardiovascular Status Stable, Respiratory Function Stable, Patent Airway and No signs of Nausea or vomiting  Post-op Vital Signs: Reviewed and stable  Complications: No notable events documented.

## 2021-11-24 ENCOUNTER — Encounter: Payer: Self-pay | Admitting: Ophthalmology

## 2021-11-24 ENCOUNTER — Other Ambulatory Visit: Payer: Self-pay

## 2021-12-01 NOTE — Discharge Instructions (Signed)

## 2021-12-06 DIAGNOSIS — H2512 Age-related nuclear cataract, left eye: Secondary | ICD-10-CM | POA: Diagnosis not present

## 2021-12-07 ENCOUNTER — Ambulatory Visit: Payer: Medicare Other | Admitting: Anesthesiology

## 2021-12-07 ENCOUNTER — Ambulatory Visit
Admission: RE | Admit: 2021-12-07 | Discharge: 2021-12-07 | Disposition: A | Discharge status: Home / Self Care | Payer: Medicare Other | Source: Ambulatory Visit | Attending: Ophthalmology | Admitting: Ophthalmology

## 2021-12-07 ENCOUNTER — Other Ambulatory Visit: Payer: Self-pay

## 2021-12-07 ENCOUNTER — Encounter
Admission: RE | Disposition: A | Discharge status: Home / Self Care | Payer: Self-pay | Source: Ambulatory Visit | Attending: Ophthalmology

## 2021-12-07 ENCOUNTER — Ambulatory Visit (AMBULATORY_SURGERY_CENTER): Payer: Medicare Other | Admitting: Anesthesiology

## 2021-12-07 ENCOUNTER — Encounter: Payer: Self-pay | Admitting: Ophthalmology

## 2021-12-07 DIAGNOSIS — I10 Essential (primary) hypertension: Secondary | ICD-10-CM

## 2021-12-07 DIAGNOSIS — Z87891 Personal history of nicotine dependence: Secondary | ICD-10-CM

## 2021-12-07 DIAGNOSIS — H2512 Age-related nuclear cataract, left eye: Secondary | ICD-10-CM | POA: Insufficient documentation

## 2021-12-07 DIAGNOSIS — E1136 Type 2 diabetes mellitus with diabetic cataract: Secondary | ICD-10-CM | POA: Diagnosis not present

## 2021-12-07 DIAGNOSIS — E119 Type 2 diabetes mellitus without complications: Secondary | ICD-10-CM

## 2021-12-07 HISTORY — PX: CATARACT EXTRACTION W/PHACO: SHX586

## 2021-12-07 SURGERY — PHACOEMULSIFICATION, CATARACT, WITH IOL INSERTION
Anesthesia: Monitor Anesthesia Care | Site: Eye | Laterality: Left

## 2021-12-07 MED ORDER — MIDAZOLAM HCL 2 MG/2ML IJ SOLN
INTRAMUSCULAR | Status: DC | PRN
  Administered 2021-12-07: 1 mg via INTRAVENOUS

## 2021-12-07 MED ORDER — BRIMONIDINE TARTRATE-TIMOLOL 0.2-0.5 % OP SOLN
OPHTHALMIC | Status: DC | PRN
  Administered 2021-12-07: 1 [drp] via OPHTHALMIC

## 2021-12-07 MED ORDER — FENTANYL CITRATE (PF) 100 MCG/2ML IJ SOLN
INTRAMUSCULAR | Status: DC | PRN
  Administered 2021-12-07 (×2): 50 ug via INTRAVENOUS

## 2021-12-07 MED ORDER — SIGHTPATH DOSE#1 NA CHONDROIT SULF-NA HYALURON 40-17 MG/ML IO SOLN
INTRAOCULAR | Status: DC | PRN
  Administered 2021-12-07: 1 mL via INTRAOCULAR

## 2021-12-07 MED ORDER — SIGHTPATH DOSE#1 BSS IO SOLN
INTRAOCULAR | Status: DC | PRN
  Administered 2021-12-07: 15 mL

## 2021-12-07 MED ORDER — SIGHTPATH DOSE#1 BSS IO SOLN
INTRAOCULAR | Status: DC | PRN
  Administered 2021-12-07: 1 mL via INTRAMUSCULAR

## 2021-12-07 MED ORDER — ARMC OPHTHALMIC DILATING DROPS
1.0000 | OPHTHALMIC | Status: DC | PRN
  Administered 2021-12-07 (×3): 1 via OPHTHALMIC

## 2021-12-07 MED ORDER — TETRACAINE HCL 0.5 % OP SOLN
1.0000 [drp] | OPHTHALMIC | Status: DC | PRN
  Administered 2021-12-07 (×3): 1 [drp] via OPHTHALMIC

## 2021-12-07 MED ORDER — SIGHTPATH DOSE#1 BSS IO SOLN
INTRAOCULAR | Status: DC | PRN
  Administered 2021-12-07: 121 mL via OPHTHALMIC

## 2021-12-07 MED ORDER — MOXIFLOXACIN HCL 0.5 % OP SOLN
OPHTHALMIC | Status: DC | PRN
  Administered 2021-12-07: 0.2 mL via OPHTHALMIC

## 2021-12-07 SURGICAL SUPPLY — 10 items
CATARACT SUITE SIGHTPATH (MISCELLANEOUS) ×1 IMPLANT
FEE CATARACT SUITE SIGHTPATH (MISCELLANEOUS) ×1 IMPLANT
GLOVE SURG ENC TEXT LTX SZ8 (GLOVE) ×1 IMPLANT
GLOVE SURG TRIUMPH 8.0 PF LTX (GLOVE) ×1 IMPLANT
LENS IOL TECNIS EYHANCE 22.5 (Intraocular Lens) IMPLANT
NDL FILTER BLUNT 18X1 1/2 (NEEDLE) ×1 IMPLANT
NEEDLE FILTER BLUNT 18X1 1/2 (NEEDLE) ×1 IMPLANT
RING MALYGIN (MISCELLANEOUS) IMPLANT
SYR 3ML LL SCALE MARK (SYRINGE) ×1 IMPLANT
WATER STERILE IRR 250ML POUR (IV SOLUTION) ×1 IMPLANT

## 2021-12-07 NOTE — H&P (Signed)
[facility]   Primary Care Physician:  Duanne Limerick, MD Ophthalmologist: Dr. Druscilla Brownie  Pre-Procedure History & Physical: HPI:  Jack Kent is a 76 y.o. male here for cataract surgery.   Past Medical History:  Diagnosis Date   Hypertension    Motion sickness    Boats    Past Surgical History:  Procedure Laterality Date   CATARACT EXTRACTION W/PHACO Right 11/23/2021   Procedure: CATARACT EXTRACTION PHACO AND INTRAOCULAR LENS PLACEMENT (IOC) RIGHT;  Surgeon: Galen Manila, MD;  Location: [facility];  Service: Ophthalmology;  Laterality: Right;  15.23 1:27.4    Prior to Admission medications   Medication Sig Start Date End Date Taking? Authorizing Provider  amLODipine (NORVASC) 5 MG tablet Take 1 tablet (5 mg total) by mouth daily. 09/06/21  Yes Duanne Limerick, MD  acetaminophen (TYLENOL) 500 MG tablet Take 500 mg by mouth every 6 (six) hours as needed.    [provider]  meloxicam (MOBIC) 7.5 MG tablet Take 1 tablet (7.5 mg total) by mouth daily. Patient not taking: Reported on 10/25/2021 09/06/21   Duanne Limerick, MD    Allergies as of 09/24/2021 - Review Complete 09/22/2021  Allergen Reaction Noted   Declomycin [demeclocycline]  12/02/2019    History reviewed. No pertinent family history.  Social History   Socioeconomic History   Marital status: Married    Spouse name: Not on file   Number of children: Not on file   Years of education: Not on file   Highest education level: Not on file  Occupational History   Not on file  Tobacco Use   Smoking status: Former    Types: Cigarettes   Smokeless tobacco: Former   Tobacco comments:    Smoked and chewed some in [employer]  Vaping Use   Vaping Use: Never used  Substance and Sexual Activity   Alcohol use: Not Currently   Drug use: Never   Sexual activity: Not Currently  Other Topics Concern   Not on file  Social History Narrative   Not on file   Social Determinants of Health    Financial Resource Strain: Low Risk  (10/25/2021)   Overall Financial Resource Strain (CARDIA)    Difficulty of Paying Living Expenses: Not hard at all  Food Insecurity: No Food Insecurity (10/25/2021)   Hunger Vital Sign    Worried About Running Out of Food in the Last Year: Never true    Ran Out of Food in the Last Year: Never true  Transportation Needs: No Transportation Needs (10/25/2021)   PRAPARE - Administrator, Civil Service (Medical): No    Lack of Transportation (Non-Medical): No  Physical Activity: Unknown (10/25/2021)   Exercise Vital Sign    Days of Exercise per Week: Not on file    Minutes of Exercise per Session: 40 min  Stress: Stress Concern Present (10/25/2021)   [employer] of Occupational Health - Occupational Stress Questionnaire    Feeling of Stress : To some extent  Social Connections: Unknown (10/25/2021)   Social Connection and Isolation Panel [NHANES]    Frequency of Communication with Friends and Family: More than three times a week    Frequency of Social Gatherings with Friends and Family: More than three times a week    Attends Religious Services: Not on file    Active Member of Clubs or Organizations: Yes    Attends Banker Meetings: More than 4 times per year  Marital Status: Married  Human resources officer Violence: Not At Risk (10/25/2021)   Humiliation, Afraid, Rape, and Kick questionnaire    Fear of Current or Ex-Partner: No    Emotionally Abused: No    Physically Abused: No    Sexually Abused: No    Review of Systems: See HPI, otherwise negative ROS  Physical Exam: BP (!) 158/87   Pulse 61   Temp 98 F (36.7 C) (Temporal)   Resp 16   Wt 72.6 kg   SpO2 99%   BMI 28.34 kg/m  General:   Alert, cooperative in NAD Head:  Normocephalic and atraumatic. Respiratory:  Normal work of breathing. Cardiovascular:  RRR  Impression/Plan: Jack Kent is here for cataract surgery.  Risks, benefits, limitations, and  alternatives regarding cataract surgery have been reviewed with the patient.  Questions have been answered.  All parties agreeable.   Birder Robson, MD  12/07/2021, 8:46 AM

## 2021-12-07 NOTE — Anesthesia Preprocedure Evaluation (Signed)
Anesthesia Evaluation  Patient identified by MRN, date of birth, ID band Patient awake    Reviewed: Allergy & Precautions, H&P , NPO status , Patient's Chart, lab work & pertinent test results, reviewed documented beta blocker date and time   Airway Mallampati: II  TM Distance: >3 FB Neck ROM: full    Dental no notable dental hx. (+) Teeth Intact   Pulmonary neg pulmonary ROS, former smoker,    Pulmonary exam normal breath sounds clear to auscultation       Cardiovascular Exercise Tolerance: Good hypertension, negative cardio ROS   Rhythm:regular Rate:Normal     Neuro/Psych negative neurological ROS  negative psych ROS   GI/Hepatic negative GI ROS, Neg liver ROS,   Endo/Other  negative endocrine ROSdiabetes  Renal/GU      Musculoskeletal   Abdominal   Peds  Hematology negative hematology ROS (+)   Anesthesia Other Findings   Reproductive/Obstetrics negative OB ROS                             Anesthesia Physical Anesthesia Plan  ASA: 2  Anesthesia Plan: MAC   Post-op Pain Management:    Induction:   PONV Risk Score and Plan:   Airway Management Planned:   Additional Equipment:   Intra-op Plan:   Post-operative Plan:   Informed Consent: I have reviewed the patients History and Physical, chart, labs and discussed the procedure including the risks, benefits and alternatives for the proposed anesthesia with the patient or authorized representative who has indicated his/her understanding and acceptance.       Plan Discussed with: CRNA  Anesthesia Plan Comments:         Anesthesia Quick Evaluation

## 2021-12-07 NOTE — Op Note (Signed)
PREOPERATIVE DIAGNOSIS:  Nuclear sclerotic cataract of the left eye.   POSTOPERATIVE DIAGNOSIS:  Nuclear sclerotic cataract of the left eye.   OPERATIVE PROCEDURE:ORPROCALL@   SURGEON:  Jack Robson, MD.   ANESTHESIA:  Anesthesiologist: Molli Barrows, MD CRNA: Ester Rink, CRNA  1.      Managed anesthesia care. 2.     0.25ml of Shugarcaine was instilled following the paracentesis   COMPLICATIONS: Viscoelastic was used to raise the pupil margin.  A  Malyugin ring was placed as the pupil would not achieve sufficient pharmacologic dilation to undergo cataract extraction safely.( The ring was removed atraumatically following insertion of the IOL.)    TECHNIQUE:   Stop and chop   DESCRIPTION OF PROCEDURE:  The patient was examined and consented in the preoperative holding area where the aforementioned topical anesthesia was applied to the left eye and then brought back to the Operating Room where the left eye was prepped and draped in the usual sterile ophthalmic fashion and a lid speculum was placed. A paracentesis was created with the side port blade and the anterior chamber was filled with viscoelastic. A near clear corneal incision was performed with the steel keratome. A continuous curvilinear capsulorrhexis was performed with a cystotome followed by the capsulorrhexis forceps. Hydrodissection and hydrodelineation were carried out with BSS on a blunt cannula. The lens was removed in a stop and chop  technique and the remaining cortical material was removed with the irrigation-aspiration handpiece. The capsular bag was inflated with viscoelastic and the Technis ZCB00 lens was placed in the capsular bag without complication. The remaining viscoelastic was removed from the eye with the irrigation-aspiration handpiece. The wounds were hydrated. The anterior chamber was flushed with BSS and the eye was inflated to physiologic pressure. 0.48ml Vigamox was placed in the anterior chamber. The wounds  were found to be water tight. The eye was dressed with Combigan. The patient was given protective glasses to wear throughout the day and a shield with which to sleep tonight. The patient was also given drops with which to begin a drop regimen today and will follow-up with me in one day. Implant Name Type Inv. Item Serial No. Manufacturer Lot No. LRB No. Used Action  LENS IOL TECNIS EYHANCE 22.5 - [id number] Intraocular Lens LENS IOL TECNIS EYHANCE 22.5 [id number] [employer]  Left 1 Implanted    Procedure(s): CATARACT EXTRACTION PHACO AND INTRAOCULAR LENS PLACEMENT (IOC) LEFT 41.14 03:07.3 (Left)  Electronically signed: Birder Kent 12/07/2021 9:16 AM

## 2021-12-07 NOTE — Transfer of Care (Signed)
Immediate Anesthesia Transfer of Care Note  Patient: Jack Kent  Procedure(s) Performed: CATARACT EXTRACTION PHACO AND INTRAOCULAR LENS PLACEMENT (IOC) LEFT 41.14 03:07.3 (Left: Eye)  Patient Location: PACU  Anesthesia Type: No value filed.  Level of Consciousness: awake, alert  and patient cooperative  Airway and Oxygen Therapy: Patient Spontanous Breathing and Patient connected to supplemental oxygen  Post-op Assessment: Post-op Vital signs reviewed, Patient's Cardiovascular Status Stable, Respiratory Function Stable, Patent Airway and No signs of Nausea or vomiting  Post-op Vital Signs: Reviewed and stable  Complications: There were no known notable events for this encounter.

## 2021-12-07 NOTE — Anesthesia Postprocedure Evaluation (Signed)
Anesthesia Post Note  Patient: Jack Kent  Procedure(s) Performed: CATARACT EXTRACTION PHACO AND INTRAOCULAR LENS PLACEMENT (IOC) LEFT 41.14 03:07.3 (Left: Eye)     Patient location during evaluation: PACU Anesthesia Type: MAC Level of consciousness: awake and alert Pain management: pain level controlled Vital Signs Assessment: post-procedure vital signs reviewed and stable Respiratory status: spontaneous breathing, nonlabored ventilation, respiratory function stable and patient connected to nasal cannula oxygen Cardiovascular status: stable and blood pressure returned to baseline Postop Assessment: no apparent nausea or vomiting Anesthetic complications: no   There were no known notable events for this encounter.  Molli Barrows

## 2021-12-08 ENCOUNTER — Encounter: Payer: Self-pay | Admitting: Ophthalmology

## 2022-01-06 ENCOUNTER — Ambulatory Visit: Payer: Medicare Other | Admitting: Family Medicine

## 2022-01-12 ENCOUNTER — Ambulatory Visit (INDEPENDENT_AMBULATORY_CARE_PROVIDER_SITE_OTHER): Payer: Medicare Other | Admitting: Family Medicine

## 2022-01-12 ENCOUNTER — Encounter: Payer: Self-pay | Admitting: Family Medicine

## 2022-01-12 VITALS — BP 160/98 | HR 70 | Ht 63.0 in | Wt 158.0 lb

## 2022-01-12 DIAGNOSIS — I10 Essential (primary) hypertension: Secondary | ICD-10-CM

## 2022-01-12 MED ORDER — HYDROCHLOROTHIAZIDE 12.5 MG PO TABS: 12.5000 mg | ORAL_TABLET | Freq: Every day | ORAL | 0 refills | Status: DC

## 2022-01-12 MED ORDER — AMLODIPINE BESYLATE 10 MG PO TABS: 10.0000 mg | ORAL_TABLET | Freq: Every day | ORAL | 0 refills | Status: DC

## 2022-01-12 NOTE — Progress Notes (Signed)
Date:  01/12/2022   Name:  Jack Kent   DOB:  February 20, 1946   MRN:  [id number]   Chief Complaint: Hypertension  Hypertension This is a chronic problem. The current episode started more than 1 year ago. The problem has been gradually improving since onset. The problem is uncontrolled. Pertinent negatives include no chest pain, headaches, neck pain, orthopnea, palpitations, peripheral edema, PND or shortness of breath. There are no associated agents to hypertension. Past treatments include calcium channel blockers. The current treatment provides moderate improvement. There are no compliance problems.  There is no history of angina, kidney disease, CAD/MI, CVA, heart failure, left ventricular hypertrophy, PVD or retinopathy. There is no history of chronic renal disease, a hypertension causing med or renovascular disease.    Lab Results  Component Value Date   NA 139 09/06/2021   K 4.2 09/06/2021   CO2 19 (L) 09/06/2021   GLUCOSE 99 09/06/2021   BUN 18 09/06/2021   CREATININE 1.21 09/06/2021   CALCIUM 9.5 09/06/2021   EGFR 62 09/06/2021   No results found for: "CHOL", "HDL", "LDLCALC", "LDLDIRECT", "TRIG", "CHOLHDL" No results found for: "TSH" No results found for: "HGBA1C" No results found for: "WBC", "HGB", "HCT", "MCV", "PLT" No results found for: "ALT", "AST", "GGT", "ALKPHOS", "BILITOT" No results found for: "25OHVITD2", "25OHVITD3", "VD25OH"   Review of Systems  Constitutional:  Negative for chills and fever.  HENT:  Negative for drooling, ear discharge, ear pain and sore throat.   Respiratory:  Negative for cough, shortness of breath and wheezing.   Cardiovascular:  Negative for chest pain, palpitations, orthopnea, leg swelling and PND.  Gastrointestinal:  Negative for abdominal pain, blood in stool, constipation, diarrhea and nausea.  Endocrine: Negative for polydipsia.  Genitourinary:  Negative for dysuria, frequency, hematuria and urgency.  Musculoskeletal:  Negative  for back pain, myalgias and neck pain.  Skin:  Negative for rash.  Allergic/Immunologic: Negative for environmental allergies.  Neurological:  Negative for dizziness and headaches.  Hematological:  Does not bruise/bleed easily.  Psychiatric/Behavioral:  Negative for suicidal ideas. The patient is not nervous/anxious.     Patient Active Problem List   Diagnosis Date Noted   Rotator cuff tendinitis, left 09/22/2021   Biceps tendinitis of left shoulder 09/22/2021   Rotator cuff tendinitis, right 09/22/2021    Allergies  Allergen Reactions   Declomycin [Demeclocycline] Hives    Past Surgical History:  Procedure Laterality Date   CATARACT EXTRACTION W/PHACO Right 11/23/2021   Procedure: CATARACT EXTRACTION PHACO AND INTRAOCULAR LENS PLACEMENT (IOC) RIGHT;  Surgeon: Galen Manila, MD;  Location: [facility];  Service: Ophthalmology;  Laterality: Right;  15.23 1:27.4   CATARACT EXTRACTION W/PHACO Left 12/07/2021   Procedure: CATARACT EXTRACTION PHACO AND INTRAOCULAR LENS PLACEMENT (IOC) LEFT 41.14 03:07.3;  Surgeon: Galen Manila, MD;  Location: [facility];  Service: Ophthalmology;  Laterality: Left;    Social History   Tobacco Use   Smoking status: Former    Types: Cigarettes   Smokeless tobacco: Former   Tobacco comments:    Smoked and chewed some in [employer] Use   Vaping Use: Never used  Substance Use Topics   Alcohol use: Not Currently   Drug use: Never     Medication list has been reviewed and updated.  Current Meds  Medication Sig   acetaminophen (TYLENOL) 500 MG tablet Take 500 mg by mouth every 6 (six) hours as needed.   amLODipine (NORVASC) 5 MG tablet Take 1 tablet (5  mg total) by mouth daily.       01/12/2022   11:18 AM 09/22/2021    1:47 PM 09/06/2021    2:06 PM  GAD 7 : Generalized Anxiety Score  Nervous, Anxious, on Edge 0 0 0  Control/stop worrying 0 0 0  Worry too much - different things 0 0 0  Trouble relaxing 0 0 0   Restless 0 0 0  Easily annoyed or irritable 0 0 0  Afraid - awful might happen 0 0 0  Total GAD 7 Score 0 0 0  Anxiety Difficulty Not difficult at all Not difficult at all Not difficult at all       01/12/2022   11:18 AM 10/25/2021    3:18 PM 09/22/2021    1:47 PM  Depression screen PHQ 2/9  Decreased Interest 0 0 0  Down, Depressed, Hopeless 0 0 0  PHQ - 2 Score 0 0 0  Altered sleeping 0  0  Tired, decreased energy 0  0  Change in appetite 0  0  Feeling bad or failure about yourself  0  0  Trouble concentrating 0  0  Moving slowly or fidgety/restless 0  0  Suicidal thoughts 0  0  PHQ-9 Score 0  0  Difficult doing work/chores Not difficult at all Not difficult at all Not difficult at all    BP Readings from Last 3 Encounters:  01/12/22 (!) 160/98  12/07/21 (!) 152/85  11/23/21 (!) 146/101    Physical Exam Vitals and nursing note reviewed.  HENT:     Head: Normocephalic.     Right Ear: External ear normal.     Left Ear: External ear normal.     Nose: Nose normal.  Eyes:     General: No scleral icterus.       Right eye: No discharge.        Left eye: No discharge.     Conjunctiva/sclera: Conjunctivae normal.     Pupils: Pupils are equal, round, and reactive to light.  Neck:     Thyroid: No thyromegaly.     Vascular: No JVD.     Trachea: No tracheal deviation.  Cardiovascular:     Rate and Rhythm: Normal rate and regular rhythm.     Heart sounds: Normal heart sounds. No murmur heard.    No friction rub. No gallop.  Pulmonary:     Effort: No respiratory distress.     Breath sounds: Normal breath sounds. No wheezing or rales.  Abdominal:     General: Bowel sounds are normal.     Palpations: Abdomen is soft. There is no mass.     Tenderness: There is no abdominal tenderness. There is no guarding or rebound.  Musculoskeletal:        General: No tenderness. Normal range of motion.     Cervical back: Neck supple.  Lymphadenopathy:     Cervical: No cervical  adenopathy.  Skin:    General: Skin is warm.     Findings: No rash.  Neurological:     Mental Status: He is alert.     Cranial Nerves: No cranial nerve deficit.     Wt Readings from Last 3 Encounters:  01/12/22 158 lb (71.7 kg)  12/07/21 160 lb (72.6 kg)  11/23/21 159 lb 9.6 oz (72.4 kg)    BP (!) 160/98 (BP Location: Right Arm, Cuff Size: Large)   Pulse 70   Ht $R'5\' 3"'WO$  (1.6 m)   Wt 158 lb (71.7  kg)   SpO2 99%   BMI 27.99 kg/m   Assessment and Plan:  1. Primary hypertension Chronic.  Improving.  Uncontrolled.  Relatively stable.  Initially treated with amlodipine 5 mg once a day which is improved blood pressure readings but still leaves an elevated reading but needs further attention.  We will double the dose to amlodipine 10 mg daily and add hydrochlorothiazide 12.5 mg once a day.  We will recheck patient in 6 weeks and at that time we will do lab work including renal panel and lipid panel. - amLODipine (NORVASC) 10 MG tablet; Take 1 tablet (10 mg total) by mouth daily.  Dispense: 90 tablet; Refill: 0 - hydrochlorothiazide (HYDRODIURIL) 12.5 MG tablet; Take 1 tablet (12.5 mg total) by mouth daily.  Dispense: 90 tablet; Refill: 0    Otilio Miu, MD

## 2022-01-12 NOTE — Patient Instructions (Signed)

## 2022-01-25 ENCOUNTER — Encounter: Payer: Self-pay | Admitting: Family Medicine

## 2022-01-25 ENCOUNTER — Ambulatory Visit (INDEPENDENT_AMBULATORY_CARE_PROVIDER_SITE_OTHER): Payer: Medicare Other | Admitting: Family Medicine

## 2022-01-25 VITALS — BP 140/78 | HR 74 | Ht 63.0 in | Wt 156.0 lb

## 2022-01-25 DIAGNOSIS — M7581 Other shoulder lesions, right shoulder: Secondary | ICD-10-CM | POA: Diagnosis not present

## 2022-01-25 DIAGNOSIS — G5603 Carpal tunnel syndrome, bilateral upper limbs: Secondary | ICD-10-CM

## 2022-01-25 DIAGNOSIS — M7582 Other shoulder lesions, left shoulder: Secondary | ICD-10-CM

## 2022-01-25 MED ORDER — GABAPENTIN 100 MG PO CAPS: 100.0000 mg | ORAL_CAPSULE | Freq: Every day | ORAL | 0 refills | Status: DC

## 2022-01-25 MED ORDER — DEXAMETHASONE 4 MG PO TABS: ORAL_TABLET | ORAL | 0 refills | Status: DC

## 2022-01-25 NOTE — Patient Instructions (Addendum)
-   Take Decadron for full 1 week course as prescribed (see bottle label instructions) - Take gabapentin nightly for 1 week (side effect might be drowsiness) - Return for follow-up in 1 week to assess progress and discuss next steps - Contact us for any questions between now and then patient returns

## 2022-01-25 NOTE — Assessment & Plan Note (Signed)
Chronic condition, last addressed during 09/2021 visit, did trial NSAIDs without significant response.  Examination does reveal 5/5 strength throughout the rotator cuff do with pain during resisted testing throughout with focality to the supraspinatus bilaterally.  He has positive impingement bilaterally.  Secondary findings of the right biceps tendon.  Given the multiple locations of pain during today's visit, plan for oral course of Decadron, gabapentin for carpal tunnel bilaterally, close follow-up in 1 week with low threshold to advance to corticosteroid injections.

## 2022-01-25 NOTE — Progress Notes (Signed)
     Primary Care / Sports Medicine Office Visit  Patient Information:  Patient ID: Jack Kent, male DOB: [birth year] Age: 76 y.o. MRN: [id number]   Jack Kent is a pleasant 76 y.o. male presenting with the following:  Chief Complaint  Patient presents with   Rotator cuff tendinitis, left, right    Pain for 4 months, right hand has started to go numb    Vitals:   01/25/22 0900  BP: (!) 140/78  Pulse: 74  SpO2: 99%   Vitals:   01/25/22 0900  Weight: 156 lb (70.8 kg)  Height: 5\' 3"  (1.6 m)   Body mass index is 27.63 kg/m.  No results found.   Independent interpretation of notes and tests performed by another provider:   None  Procedures performed:   None  Pertinent History, Exam, Impression, and Recommendations:   Problem List Items Addressed This Visit       Nervous and Auditory   Carpal tunnel syndrome, bilateral    Patient presents for right greater than left hand paresthesias in the setting of acute on chronic bilateral shoulder pain.  Examination of bilateral hands without atrophy, positive Tinel's on the right, positive Phalen's bilaterally.  Clinical history and findings are most consistent with carpal tunnel syndrome, an element of secondary/compensatory etiology considered given comorbid shoulder issues.  Plan on course of Decadron, gabapentin, close follow-up in 1 week with low threshold to advance to corticosteroid injections.  If improved, maintenance measures to be discussed.      Relevant Medications   gabapentin (NEURONTIN) 100 MG capsule     Musculoskeletal and Integument   Rotator cuff tendinitis, left    See additional assessment(s) for plan details.      Rotator cuff tendinitis, right - Primary    Chronic condition, last addressed during 09/2021 visit, did trial NSAIDs without significant response.  Examination does reveal 5/5 strength throughout the rotator cuff do with pain during resisted testing throughout with focality to the  supraspinatus bilaterally.  He has positive impingement bilaterally.  Secondary findings of the right biceps tendon.  Given the multiple locations of pain during today's visit, plan for oral course of Decadron, gabapentin for carpal tunnel bilaterally, close follow-up in 1 week with low threshold to advance to corticosteroid injections.        Orders & Medications Meds ordered this encounter  Medications   dexamethasone (DECADRON) 4 MG tablet    Sig: Take 2 tablets (8 mg) by mouth for one dose, then continue with 1 tablet 4 times per day afterwards for a total course of 7 days.    Dispense:  29 tablet    Refill:  0   gabapentin (NEURONTIN) 100 MG capsule    Sig: Take 1 capsule (100 mg total) by mouth at bedtime.    Dispense:  7 capsule    Refill:  0   No orders of the defined types were placed in this encounter.    Return in about 1 week (around 02/01/2022).     Montel Culver, MD   Primary Care Sports Medicine [place]

## 2022-01-25 NOTE — Assessment & Plan Note (Signed)
Patient presents for right greater than left hand paresthesias in the setting of acute on chronic bilateral shoulder pain.  Examination of bilateral hands without atrophy, positive Tinel's on the right, positive Phalen's bilaterally.  Clinical history and findings are most consistent with carpal tunnel syndrome, an element of secondary/compensatory etiology considered given comorbid shoulder issues.  Plan on course of Decadron, gabapentin, close follow-up in 1 week with low threshold to advance to corticosteroid injections.  If improved, maintenance measures to be discussed.

## 2022-01-25 NOTE — Assessment & Plan Note (Signed)
See additional assessment(s) for plan details. 

## 2022-02-01 ENCOUNTER — Inpatient Hospital Stay (INDEPENDENT_AMBULATORY_CARE_PROVIDER_SITE_OTHER): Payer: Medicare Other | Admitting: Radiology

## 2022-02-01 ENCOUNTER — Encounter: Payer: Self-pay | Admitting: Family Medicine

## 2022-02-01 ENCOUNTER — Ambulatory Visit (INDEPENDENT_AMBULATORY_CARE_PROVIDER_SITE_OTHER): Payer: Medicare Other | Admitting: Family Medicine

## 2022-02-01 VITALS — BP 124/78 | HR 66 | Ht 63.0 in | Wt 166.0 lb

## 2022-02-01 DIAGNOSIS — M7522 Bicipital tendinitis, left shoulder: Secondary | ICD-10-CM

## 2022-02-01 DIAGNOSIS — G5601 Carpal tunnel syndrome, right upper limb: Secondary | ICD-10-CM

## 2022-02-01 DIAGNOSIS — M7582 Other shoulder lesions, left shoulder: Secondary | ICD-10-CM

## 2022-02-01 DIAGNOSIS — M7581 Other shoulder lesions, right shoulder: Secondary | ICD-10-CM

## 2022-02-01 DIAGNOSIS — G5603 Carpal tunnel syndrome, bilateral upper limbs: Secondary | ICD-10-CM | POA: Diagnosis not present

## 2022-02-01 MED ORDER — TRIAMCINOLONE ACETONIDE 40 MG/ML IJ SUSP
120.0000 mg | Freq: Once | INTRAMUSCULAR | Status: AC
  Administered 2022-02-01: 120 mg via INTRAMUSCULAR

## 2022-02-01 MED ORDER — GABAPENTIN 100 MG PO CAPS: 100.0000 mg | ORAL_CAPSULE | Freq: Every evening | ORAL | 0 refills | Status: DC | PRN

## 2022-02-01 NOTE — Assessment & Plan Note (Signed)
See additional assessment(s) for plan details. 

## 2022-02-01 NOTE — Assessment & Plan Note (Signed)
Chronic condition that did show benefit from Decadron, that being said he is still rather symptomatic both anterior and lateral shoulder with pain during forward flexion and abduction.  Findings today are consistent with biceps tendinitis and rotator cuff tendinopathy with focality to the supraspinatus.  He did elect to proceed with ultrasound-guided corticosteroid injections at both the subacromial and biceps tendon sheath regions.  Post care reviewed, home exercises provided.  He can follow-up as needed for this issue.

## 2022-02-01 NOTE — Progress Notes (Signed)
Primary Care / Sports Medicine Office Visit  Patient Information:  Patient ID: Jack Kent, male DOB: [birth year] Age: 76 y.o. MRN: [id number]   Jack Kent is a pleasant 76 y.o. male presenting with the following:  Chief Complaint  Patient presents with   Shoulder Pain    Left Shoulder    Vitals:   02/01/22 0930  BP: 124/78  Pulse: 66  SpO2: 97%   Vitals:   02/01/22 0930  Weight: 166 lb (75.3 kg)  Height: 5\' 3"  (1.6 m)   Body mass index is 29.41 kg/m.  No results found.   Independent interpretation of notes and tests performed by another provider:   None  Procedures performed:   Procedure:  Injection of left subacromial space under ultrasound guidance. Ultrasound guidance utilized for in-plane approach to subacromial space, no bursitis noted Samsung HS60 device utilized with permanent recording / reporting. Verbal informed consent obtained and verified. Skin prepped in a sterile fashion. Ethyl chloride for topical local analgesia.  Completed without difficulty and tolerated well. Medication: triamcinolone acetonide 40 mg/mL suspension for injection 1 mL total and 2 mL lidocaine 1% without epinephrine utilized for needle placement anesthetic Advised to contact for fevers/chills, erythema, induration, drainage, or persistent bleeding.  Procedure:  Injection of left biceps tendon sheath under ultrasound guidance. Ultrasound guidance utilized for out of plane approach to the left biceps tendon sheath, fluid surrounding the biceps tendon noted Samsung HS60 device utilized with permanent recording / reporting. Verbal informed consent obtained and verified. Skin prepped in a sterile fashion. Ethyl chloride for topical local analgesia.  Completed without difficulty and tolerated well. Medication: triamcinolone acetonide 40 mg/mL suspension for injection 1 mL total and 2 mL lidocaine 1% without epinephrine utilized for needle placement anesthetic Advised to  contact for fevers/chills, erythema, induration, drainage, or persistent bleeding.  Procedure:  Injection of right carpal tunnel under ultrasound guidance. Ultrasound guidance utilized for in-plane approach to right median nerve, injectate around the median nerve noted, hydrodissection performed Samsung HS60 device utilized with permanent recording / reporting. Verbal informed consent obtained and verified. Skin prepped in a sterile fashion. Ethyl chloride for topical local analgesia.  Completed without difficulty and tolerated well. Medication: triamcinolone acetonide 40 mg/mL suspension for injection 1 mL total and 2 mL lidocaine 1% without epinephrine utilized for needle placement anesthetic Advised to contact for fevers/chills, erythema, induration, drainage, or persistent bleeding.   Pertinent History, Exam, Impression, and Recommendations:   Problem List Items Addressed This Visit       Nervous and Auditory   Carpal tunnel syndrome, bilateral    Patient has noted improvement following Decadron and gabapentin.  Given that his left side has improved greater than the right side, I have advised transition to nightly as needed gabapentin, start of home exercises, follow-up as needed. See additional assessment(s) for plan details.      Relevant Medications   gabapentin (NEURONTIN) 100 MG capsule   Carpal tunnel syndrome on right    While patient has not noted improvement from the Decadron and gabapentin, he is still symptomatic more so in the right than the left hand.  We discussed adjunct treatment strategies and he did elect to proceed with ultrasound-guided carpal tunnel/median nerve injection.  Post care reviewed, home exercises provided.  He can follow-up as needed for this issue.      Relevant Medications   gabapentin (NEURONTIN) 100 MG capsule   Other Relevant Orders   [place] LIMITED JOINT SPACE STRUCTURES UP  BILAT     Musculoskeletal and Integument   Rotator cuff tendinitis,  left - Primary    Chronic condition that did show benefit from Decadron, that being said he is still rather symptomatic both anterior and lateral shoulder with pain during forward flexion and abduction.  Findings today are consistent with biceps tendinitis and rotator cuff tendinopathy with focality to the supraspinatus.  He did elect to proceed with ultrasound-guided corticosteroid injections at both the subacromial and biceps tendon sheath regions.  Post care reviewed, home exercises provided.  He can follow-up as needed for this issue.      Relevant Orders   [place] LIMITED JOINT SPACE STRUCTURES UP BILAT   Biceps tendinitis of left shoulder    See additional assessment(s) for plan details.      Relevant Orders   [place] LIMITED JOINT SPACE STRUCTURES UP BILAT   Rotator cuff tendinitis, right    Chronic condition, improved though still symptomatic.  Tolerated Decadron, reports symptoms persist though are tolerable, home exercises provided.  Recalcitrant symptoms can be addressed with cortisone at patient's discretion.        Orders & Medications Meds ordered this encounter  Medications   gabapentin (NEURONTIN) 100 MG capsule    Sig: Take 1 capsule (100 mg total) by mouth at bedtime as needed.    Dispense:  30 capsule    Refill:  0   triamcinolone acetonide (KENALOG-40) injection 120 mg   Orders Placed This Encounter  Procedures   [place] LIMITED JOINT SPACE STRUCTURES UP BILAT     Return if symptoms worsen or fail to improve.     Jerrol Banana, MD   Primary Care Sports Medicine [facility]

## 2022-02-01 NOTE — Patient Instructions (Addendum)
You have just been given a cortisone injection to reduce pain and inflammation. After the injection you may notice immediate relief of pain as a result of the Lidocaine. It is important to rest the area of the injection for 24 to 48 hours after the injection. There is a possibility of some temporary increased discomfort and swelling for up to 72 hours until the cortisone begins to work. If you do have pain, simply rest the joint and use ice. If you can tolerate over the counter medications, you can try Tylenol, Aleve, or Advil for added relief per package instructions. - As above, relative rest for 2 days and gradual return to full activity - Use gabapentin nightly for numbness/tingling on an as-needed basis - Start home exercise with information provided for the shoulder and wrist - Can use Voltaren gel sample up to 4 times / day as-needed for pain control, apply to clean & unbroken skin - Contact for questions and follow-up as needed

## 2022-02-01 NOTE — Assessment & Plan Note (Signed)
Patient has noted improvement following Decadron and gabapentin.  Given that his left side has improved greater than the right side, I have advised transition to nightly as needed gabapentin, start of home exercises, follow-up as needed. See additional assessment(s) for plan details.

## 2022-02-01 NOTE — Assessment & Plan Note (Signed)
Chronic condition, improved though still symptomatic.  Tolerated Decadron, reports symptoms persist though are tolerable, home exercises provided.  Recalcitrant symptoms can be addressed with cortisone at patient's discretion.

## 2022-02-01 NOTE — Assessment & Plan Note (Signed)
While patient has not noted improvement from the Decadron and gabapentin, he is still symptomatic more so in the right than the left hand.  We discussed adjunct treatment strategies and he did elect to proceed with ultrasound-guided carpal tunnel/median nerve injection.  Post care reviewed, home exercises provided.  He can follow-up as needed for this issue.

## 2022-02-02 ENCOUNTER — Ambulatory Visit: Payer: Medicare Other | Admitting: Family Medicine

## 2022-02-14 ENCOUNTER — Encounter: Payer: Self-pay | Admitting: Family Medicine

## 2022-02-14 ENCOUNTER — Ambulatory Visit (INDEPENDENT_AMBULATORY_CARE_PROVIDER_SITE_OTHER): Payer: Medicare Other | Admitting: Family Medicine

## 2022-02-14 VITALS — BP 126/70 | HR 84 | Ht 63.0 in | Wt 162.0 lb

## 2022-02-14 DIAGNOSIS — I10 Essential (primary) hypertension: Secondary | ICD-10-CM | POA: Diagnosis not present

## 2022-02-14 DIAGNOSIS — M65351 Trigger finger, right little finger: Secondary | ICD-10-CM | POA: Diagnosis not present

## 2022-02-14 DIAGNOSIS — Z23 Encounter for immunization: Secondary | ICD-10-CM | POA: Diagnosis not present

## 2022-02-14 MED ORDER — HYDROCHLOROTHIAZIDE 12.5 MG PO TABS: 12.5000 mg | ORAL_TABLET | Freq: Every day | ORAL | 1 refills | Status: DC

## 2022-02-14 MED ORDER — AMLODIPINE BESYLATE 10 MG PO TABS: 10.0000 mg | ORAL_TABLET | Freq: Every day | ORAL | 1 refills | Status: DC

## 2022-02-14 NOTE — Progress Notes (Signed)
Date:  02/14/2022   Name:  Jack Kent   DOB:  1945-12-27   MRN:  [id number]   Chief Complaint: Hypertension (Increased amlodipine to 6m at last visit) and pneumonia vaccine  Hypertension This is a chronic problem. The current episode started more than 1 year ago. The problem has been gradually improving since onset. The problem is controlled. Pertinent negatives include no anxiety, blurred vision, chest pain, headaches, malaise/fatigue, neck pain, orthopnea, palpitations, peripheral edema, PND, shortness of breath or sweats. There are no associated agents to hypertension. Past treatments include calcium channel blockers. There are no compliance problems.  There is no history of CAD/MI or CVA. There is no history of chronic renal disease, a hypertension causing med or renovascular disease.  Hand Pain  The incident occurred more than 1 week ago. There was no injury mechanism. The pain is present in the right hand. The quality of the pain is described as aching (popping). Pertinent negatives include no chest pain, muscle weakness, numbness or tingling.    Lab Results  Component Value Date   NA 139 09/06/2021   K 4.2 09/06/2021   CO2 19 (L) 09/06/2021   GLUCOSE 99 09/06/2021   BUN 18 09/06/2021   CREATININE 1.21 09/06/2021   CALCIUM 9.5 09/06/2021   EGFR 62 09/06/2021   No results found for: "CHOL", "HDL", "LDLCALC", "LDLDIRECT", "TRIG", "CHOLHDL" No results found for: "TSH" No results found for: "HGBA1C" No results found for: "WBC", "HGB", "HCT", "MCV", "PLT" No results found for: "ALT", "AST", "GGT", "ALKPHOS", "BILITOT" No results found for: "25OHVITD2", "25OHVITD3", "VD25OH"   Review of Systems  Constitutional:  Negative for chills, fever and malaise/fatigue.  HENT:  Negative for drooling, ear discharge, ear pain and sore throat.   Eyes:  Negative for blurred vision.  Respiratory:  Negative for cough, shortness of breath and wheezing.   Cardiovascular:  Negative for  chest pain, palpitations, orthopnea, leg swelling and PND.  Gastrointestinal:  Negative for abdominal pain, blood in stool, constipation, diarrhea and nausea.  Endocrine: Negative for polydipsia.  Genitourinary:  Negative for dysuria, frequency, hematuria and urgency.  Musculoskeletal:  Negative for back pain, myalgias and neck pain.  Skin:  Negative for rash.  Allergic/Immunologic: Negative for environmental allergies.  Neurological:  Negative for dizziness, tingling, numbness and headaches.  Hematological:  Does not bruise/bleed easily.  Psychiatric/Behavioral:  Negative for suicidal ideas. The patient is not nervous/anxious.     Patient Active Problem List   Diagnosis Date Noted   Carpal tunnel syndrome on right 02/01/2022   Carpal tunnel syndrome, bilateral 01/25/2022   Rotator cuff tendinitis, left 09/22/2021   Biceps tendinitis of left shoulder 09/22/2021   Rotator cuff tendinitis, right 09/22/2021    Allergies  Allergen Reactions   Declomycin [Demeclocycline] Hives    Past Surgical History:  Procedure Laterality Date   CATARACT EXTRACTION W/PHACO Right 11/23/2021   Procedure: CATARACT EXTRACTION PHACO AND INTRAOCULAR LENS PLACEMENT ([place] RIGHT;  Surgeon: PBirder Robson MD;  [facility]  Service: Ophthalmology;  Laterality: Right;  15.23 1:27.4   CATARACT EXTRACTION W/PHACO Left 12/07/2021   Procedure: CATARACT EXTRACTION PHACO AND INTRAOCULAR LENS PLACEMENT ([facility]) LEFT 41.14 03:07.3;  Surgeon: PBirder Robson MD;  [facility] [facility]  Service: Ophthalmology;  Laterality: Left;    Social History   Tobacco Use   Smoking status: Never   Smokeless tobacco: Former    Types: Chew   Tobacco comments:    [place] [employer]  Vaping Use   Vaping Use: Never used  Substance Use Topics   Alcohol use: Not Currently   Drug use: Never     Medication list has been reviewed and updated.  Current Meds  Medication Sig    acetaminophen (TYLENOL) 500 MG tablet Take 500 mg by mouth every 6 (six) hours as needed.   amLODipine (NORVASC) 10 MG tablet Take 1 tablet (10 mg total) by mouth daily.   gabapentin (NEURONTIN) 100 MG capsule Take 1 capsule (100 mg total) by mouth at bedtime as needed.   hydrochlorothiazide (HYDRODIURIL) 12.5 MG tablet Take 1 tablet (12.5 mg total) by mouth daily.       02/14/2022   11:02 AM 01/12/2022   11:18 AM 09/22/2021    1:47 PM 09/06/2021    2:06 PM  GAD 7 : Generalized Anxiety Score  Nervous, Anxious, on Edge 0 0 0 0  Control/stop worrying 0 0 0 0  Worry too much - different things 0 0 0 0  Trouble relaxing 0 0 0 0  Restless 0 0 0 0  Easily annoyed or irritable 0 0 0 0  Afraid - awful might happen 0 0 0 0  Total GAD 7 Score 0 0 0 0  Anxiety Difficulty Not difficult at all Not difficult at all Not difficult at all Not difficult at all       02/14/2022   11:01 AM 01/12/2022   11:18 AM 10/25/2021    3:18 PM  Depression screen PHQ 2/9  Decreased Interest 0 0 0  Down, Depressed, Hopeless 0 0 0  PHQ - 2 Score 0 0 0  Altered sleeping 0 0   Tired, decreased energy 0 0   Change in appetite 0 0   Feeling bad or failure about yourself  0 0   Trouble concentrating 0 0   Moving slowly or fidgety/restless 0 0   Suicidal thoughts 0 0   PHQ-9 Score 0 0   Difficult doing work/chores Not difficult at all Not difficult at all Not difficult at all    BP Readings from Last 3 Encounters:  02/14/22 126/70  02/01/22 124/78  01/25/22 (!) 140/78    Physical Exam Vitals and nursing note reviewed.  HENT:     Head: Normocephalic.     Right Ear: Tympanic membrane and external ear normal.     Left Ear: Tympanic membrane and external ear normal.     Nose: Nose normal.  Eyes:     General: No scleral icterus.       Right eye: No discharge.        Left eye: No discharge.     Conjunctiva/sclera: Conjunctivae normal.     Pupils: Pupils are equal, round, and reactive to light.  Neck:      Thyroid: No thyromegaly.     Vascular: No JVD.     Trachea: No tracheal deviation.  Cardiovascular:     Rate and Rhythm: Normal rate and regular rhythm.     Heart sounds: Normal heart sounds. No murmur heard.    No friction rub. No gallop.  Pulmonary:     Effort: No respiratory distress.     Breath sounds: Normal breath sounds. No wheezing, rhonchi or rales.  Abdominal:     Palpations: Abdomen is soft. There is no mass.     Tenderness: There is no abdominal tenderness. There is no guarding or rebound.  Musculoskeletal:        General: Normal range of motion.  Right hand: Tenderness present. No swelling. Normal strength. Normal sensation.     Cervical back: Neck supple.     Comments: Palmer aspect right hand  Lymphadenopathy:     Cervical: No cervical adenopathy.  Skin:    General: Skin is warm.     Findings: No rash.  Neurological:     Mental Status: He is alert.     Cranial Nerves: No cranial nerve deficit.     Wt Readings from Last 3 Encounters:  02/14/22 162 lb (73.5 kg)  02/01/22 166 lb (75.3 kg)  01/25/22 156 lb (70.8 kg)    BP 126/70   Pulse 84   Ht _0  (1.6 m)   Wt 162 lb (73.5 kg)   SpO2 98%   BMI 28.70 kg/m   Assessment and Plan: 1. Primary hypertension Chronic.  Controlled.  Stable.  Blood pressure 126/70.  Continue amlodipine 10 mg once a day and hydrochlorothiazide 12.5 mg once a day.  Will recheck patient in 6 months. - amLODipine (NORVASC) 10 MG tablet; Take 1 tablet (10 mg total) by mouth daily.  Dispense: 90 tablet; Refill: 1 - hydrochlorothiazide (HYDRODIURIL) 12.5 MG tablet; Take 1 tablet (12.5 mg total) by mouth daily.  Dispense: 90 tablet; Refill: 1  2. Trigger little finger of right hand New onset.  Episodic.  Uncertain if this is a trigger finger episode or has to do with arthritis.  Patient is feeling a popping sensation in the MP joint of the right fifth digit.  There is some discomfort on palpation on the palmar aspect.  He has awake  and at night with his fingers and a flexed position that he had to "pop them open ".  Will refer to sports medicine for evaluation and possible intervention.  3. Need for pneumococcal vaccination Discussed and administered. - Pneumococcal conjugate vaccine 20-valent (Prevnar 20)    Otilio Miu, MD

## 2022-02-17 ENCOUNTER — Encounter: Payer: Self-pay | Admitting: Family Medicine

## 2022-02-17 ENCOUNTER — Ambulatory Visit (INDEPENDENT_AMBULATORY_CARE_PROVIDER_SITE_OTHER): Payer: Medicare Other | Admitting: Family Medicine

## 2022-02-17 VITALS — BP 118/72 | HR 86 | Ht 63.0 in | Wt 163.0 lb

## 2022-02-17 DIAGNOSIS — M65351 Trigger finger, right little finger: Secondary | ICD-10-CM | POA: Diagnosis not present

## 2022-02-17 NOTE — Assessment & Plan Note (Signed)
Noted over the past 3 weeks, progressive, involving intermittent catching/locking.  Patient is right-hand-dominant.  No treatments to date.  Denies any incidences of this in the past, no preceding change in activity relayed.  Examination with tender nodularity at the distal palmar crease at the base of the fifth digit, passive flexion/extension with intermittent triggering and mobile nodule that is tender.  He is able to actively make the finger lock.  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

## 2022-02-17 NOTE — Progress Notes (Signed)
     Primary Care / Sports Medicine Office Visit  Patient Information:  Patient ID: Jack Kent, male DOB: 1945-06-13 Age: 76 y.o. MRN: [id number]   Jack Kent is a pleasant 76 y.o. male presenting with the following:  Chief Complaint  Patient presents with   Trigger Finger    Right little finger popping, for about 3 weeks.     Vitals:   02/17/22 1051  BP: 118/72  Pulse: 86  SpO2: 98%   Vitals:   02/17/22 1051  Weight: 163 lb (73.9 kg)  Height: 5\' 3"  (1.6 m)   Body mass index is 28.87 kg/m.     Independent interpretation of notes and tests performed by another provider:   None  Procedures performed:   None  Pertinent History, Exam, Impression, and Recommendations:   Problem List Items Addressed This Visit       Musculoskeletal and Integument   Trigger finger, right little finger - Primary    Noted over the past 3 weeks, progressive, involving intermittent catching/locking.  Patient is right-hand-dominant.  No treatments to date.  Denies any incidences of this in the past, no preceding change in activity relayed.  Examination with tender nodularity at the distal palmar crease at the base of the fifth digit, passive flexion/extension with intermittent triggering and mobile nodule that is tender.  He is able to actively make the finger lock.  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        Orders & Medications No orders of the defined types were placed in this encounter.  No orders of the defined types were placed in this encounter.    No follow-ups on file.     [place], MD    Primary Care Sports Medicine [facility]

## 2022-07-01 DIAGNOSIS — H43813 Vitreous degeneration, bilateral: Secondary | ICD-10-CM | POA: Diagnosis not present

## 2022-07-01 DIAGNOSIS — M3501 Sicca syndrome with keratoconjunctivitis: Secondary | ICD-10-CM | POA: Diagnosis not present

## 2022-07-01 DIAGNOSIS — Z961 Presence of intraocular lens: Secondary | ICD-10-CM | POA: Diagnosis not present

## 2022-07-08 ENCOUNTER — Ambulatory Visit (INDEPENDENT_AMBULATORY_CARE_PROVIDER_SITE_OTHER): Payer: Medicare Other | Admitting: Family Medicine

## 2022-07-08 ENCOUNTER — Encounter: Payer: Self-pay | Admitting: Family Medicine

## 2022-07-08 VITALS — BP 120/64 | HR 67 | Ht 63.0 in | Wt 159.0 lb

## 2022-07-08 DIAGNOSIS — I10 Essential (primary) hypertension: Secondary | ICD-10-CM

## 2022-07-08 MED ORDER — AMLODIPINE BESYLATE 10 MG PO TABS: 10.0000 mg | ORAL_TABLET | Freq: Every day | ORAL | 1 refills | Status: DC

## 2022-07-08 MED ORDER — HYDROCHLOROTHIAZIDE 12.5 MG PO TABS: 12.5000 mg | ORAL_TABLET | Freq: Every day | ORAL | 1 refills | Status: DC

## 2022-07-08 NOTE — Progress Notes (Signed)
Date:  07/08/2022   Name:  Jack Kent   DOB:  [birth year]   MRN:  [id number]   Chief Complaint: Hypertension  Hypertension This is a chronic problem. The current episode started more than 1 year ago. The problem has been gradually improving since onset. The problem is controlled. Pertinent negatives include no anxiety, blurred vision, chest pain, headaches, malaise/fatigue, neck pain, orthopnea, palpitations, peripheral edema, PND or shortness of breath. There are no associated agents to hypertension. There are no known risk factors for coronary artery disease. Past treatments include calcium channel blockers. The current treatment provides mild improvement. There are no compliance problems.  There is no history of angina, kidney disease, CAD/MI or CVA. There is no history of chronic renal disease, a hypertension causing med or renovascular disease.    Lab Results  Component Value Date   NA 139 09/06/2021   K 4.2 09/06/2021   CO2 19 (L) 09/06/2021   GLUCOSE 99 09/06/2021   BUN 18 09/06/2021   CREATININE 1.21 09/06/2021   CALCIUM 9.5 09/06/2021   EGFR 62 09/06/2021   No results found for: "CHOL", "HDL", "LDLCALC", "LDLDIRECT", "TRIG", "CHOLHDL" No results found for: "TSH" No results found for: "HGBA1C" No results found for: "WBC", "HGB", "HCT", "MCV", "PLT" No results found for: "ALT", "AST", "GGT", "ALKPHOS", "BILITOT" No results found for: "25OHVITD2", "25OHVITD3", "VD25OH"   Review of Systems  Constitutional:  Negative for malaise/fatigue and unexpected weight change.  HENT:  Negative for nosebleeds and postnasal drip.   Eyes:  Negative for blurred vision.  Respiratory:  Negative for choking, chest tightness, shortness of breath and wheezing.   Cardiovascular:  Negative for chest pain, palpitations, orthopnea, leg swelling and PND.  Gastrointestinal:  Negative for abdominal distention, blood in stool, diarrhea, nausea and rectal pain.  Musculoskeletal:  Negative for neck  pain.  Neurological:  Negative for headaches.    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   Rotator cuff tendinitis, left 09/22/2021   Biceps tendinitis of left shoulder 09/22/2021   Rotator cuff tendinitis, right 09/22/2021    Allergies  Allergen Reactions   Declomycin [Demeclocycline] Hives    Past Surgical History:  Procedure Laterality Date   CATARACT EXTRACTION W/PHACO Right 11/23/2021   Procedure: CATARACT EXTRACTION PHACO AND INTRAOCULAR LENS PLACEMENT (IOC) RIGHT;  Surgeon: Galen Manila, MD;  Location: [facility];  Service: Ophthalmology;  Laterality: Right;  15.23 1:27.4   CATARACT EXTRACTION W/PHACO Left 12/07/2021   Procedure: CATARACT EXTRACTION PHACO AND INTRAOCULAR LENS PLACEMENT (IOC) LEFT 41.14 03:07.3;  Surgeon: Galen Manila, MD;  Location: [facility];  Service: Ophthalmology;  Laterality: Left;    Social History   Tobacco Use   Smoking status: Never   Smokeless tobacco: Former    Types: Chew   Tobacco comments:    Smoked and chewed some in [employer] Use   Vaping Use: Never used  Substance Use Topics   Alcohol use: Not Currently   Drug use: Never     Medication list has been reviewed and updated.  Current Meds  Medication Sig   acetaminophen (TYLENOL) 500 MG tablet Take 500 mg by mouth every 6 (six) hours as needed.   amLODipine (NORVASC) 10 MG tablet Take 1 tablet (10 mg total) by mouth daily.   gabapentin (NEURONTIN) 100 MG capsule Take 1 capsule (  100 mg total) by mouth at bedtime as needed.   hydrochlorothiazide (HYDRODIURIL) 12.5 MG tablet Take 1 tablet (12.5 mg total) by mouth daily.       07/08/2022    1:47 PM 02/14/2022   11:02 AM 01/12/2022   11:18 AM 09/22/2021    1:47 PM  GAD 7 : Generalized Anxiety Score  Nervous, Anxious, on Edge 0 0 0 0   Control/stop worrying 0 0 0 0  Worry too much - different things 0 0 0 0  Trouble relaxing 0 0 0 0  Restless 0 0 0 0  Easily annoyed or irritable 0 0 0 0  Afraid - awful might happen 0 0 0 0  Total GAD 7 Score 0 0 0 0  Anxiety Difficulty Not difficult at all Not difficult at all Not difficult at all Not difficult at all       07/08/2022    1:46 PM 02/14/2022   11:01 AM 01/12/2022   11:18 AM  Depression screen PHQ 2/9  Decreased Interest 0 0 0  Down, Depressed, Hopeless 0 0 0  PHQ - 2 Score 0 0 0  Altered sleeping 0 0 0  Tired, decreased energy 0 0 0  Change in appetite 0 0 0  Feeling bad or failure about yourself  0 0 0  Trouble concentrating 0 0 0  Moving slowly or fidgety/restless 0 0 0  Suicidal thoughts 0 0 0  PHQ-9 Score 0 0 0  Difficult doing work/chores Not difficult at all Not difficult at all Not difficult at all    BP Readings from Last 3 Encounters:  07/08/22 120/64  02/17/22 118/72  02/14/22 126/70    Physical Exam Vitals and nursing note reviewed.  HENT:     Head: Normocephalic.     Right Ear: Tympanic membrane and external ear normal.     Left Ear: Tympanic membrane and external ear normal.     Nose: Nose normal. No congestion or rhinorrhea.     Mouth/Throat:     Pharynx: No oropharyngeal exudate or posterior oropharyngeal erythema.  Eyes:     General: No scleral icterus.       Right eye: No discharge.        Left eye: No discharge.     Conjunctiva/sclera: Conjunctivae normal.     Pupils: Pupils are equal, round, and reactive to light.  Neck:     Thyroid: No thyromegaly.     Vascular: No JVD.     Trachea: No tracheal deviation.  Cardiovascular:     Rate and Rhythm: Normal rate and regular rhythm.     Heart sounds: Normal heart sounds. No murmur heard.    No friction rub. No gallop.  Pulmonary:     Effort: No respiratory distress.     Breath sounds: Normal breath sounds. No wheezing, rhonchi or rales.  Abdominal:     General: Bowel sounds  are normal.     Palpations: Abdomen is soft. There is no mass.     Tenderness: There is no abdominal tenderness. There is no guarding or rebound.  Musculoskeletal:        General: No tenderness. Normal range of motion.     Cervical back: Normal range of motion and neck supple.  Lymphadenopathy:     Cervical: No cervical adenopathy.  Skin:    General: Skin is warm.     Findings: No rash.  Neurological:     Mental Status: He is alert and oriented to person,  place, and time.     Cranial Nerves: No cranial nerve deficit.     Deep Tendon Reflexes: Reflexes are normal and symmetric.     Wt Readings from Last 3 Encounters:  07/08/22 159 lb (72.1 kg)  02/17/22 163 lb (73.9 kg)  02/14/22 162 lb (73.5 kg)    BP 120/64   Pulse 67   Ht  (1.6 m)   Wt 159 lb (72.1 kg)   SpO2 98%   BMI 28.17 kg/m   Assessment and Plan: 1. Primary hypertension Chronic.  Controlled.  Stable.  Blood pressure 120/64.  Asymptomatic.  Tolerating medications well.  Continue amlodipine 10 mg once a day and hydrochlorothiazide 12.5 mg once a day.  Will check lipid panel along with comprehensive metabolic panel for electrolytes and GFR.  Will recheck patient in 6 months. - amLODipine (NORVASC) 10 MG tablet; Take 1 tablet (10 mg total) by mouth daily.  Dispense: 90 tablet; Refill: 1 - hydrochlorothiazide (HYDRODIURIL) 12.5 MG tablet; Take 1 tablet (12.5 mg total) by mouth daily.  Dispense: 90 tablet; Refill: 1 - Lipid Panel With LDL/HDL Ratio - Comprehensive Metabolic Panel (CMET)     Elizabeth Sauer, MD

## 2022-07-09 LAB — COMPREHENSIVE METABOLIC PANEL
ALT: 13 IU/L (ref 0–44)
AST: 13 IU/L (ref 0–40)
Albumin/Globulin Ratio: 2 (ref 1.2–2.2)
Albumin: 4.8 g/dL (ref 3.8–4.8)
Alkaline Phosphatase: 80 IU/L (ref 44–121)
BUN/Creatinine Ratio: 19 (ref 10–24)
BUN: 22 mg/dL (ref 8–27)
Bilirubin Total: 0.6 mg/dL (ref 0.0–1.2)
CO2: 23 mmol/L (ref 20–29)
Calcium: 9.4 mg/dL (ref 8.6–10.2)
Chloride: 101 mmol/L (ref 96–106)
Creatinine, Ser: 1.16 mg/dL (ref 0.76–1.27)
Globulin, Total: 2.4 g/dL (ref 1.5–4.5)
Glucose: 103 mg/dL — ABNORMAL HIGH (ref 70–99)
Potassium: 3.4 mmol/L — ABNORMAL LOW (ref 3.5–5.2)
Sodium: 141 mmol/L (ref 134–144)
Total Protein: 7.2 g/dL (ref 6.0–8.5)
eGFR: 66 mL/min/{1.73_m2} (ref >59)

## 2022-07-09 LAB — LIPID PANEL WITH LDL/HDL RATIO
Cholesterol, Total: 217 mg/dL — ABNORMAL HIGH (ref 100–199)
HDL: 33 mg/dL — ABNORMAL LOW (ref >39)
LDL Chol Calc (NIH): 130 mg/dL — ABNORMAL HIGH (ref 0–99)
LDL/HDL Ratio: 3.9 ratio — ABNORMAL HIGH (ref 0.0–3.6)
Triglycerides: 304 mg/dL — ABNORMAL HIGH (ref 0–149)
VLDL Cholesterol Cal: 54 mg/dL — ABNORMAL HIGH (ref 5–40)

## 2022-11-08 ENCOUNTER — Ambulatory Visit (INDEPENDENT_AMBULATORY_CARE_PROVIDER_SITE_OTHER): Payer: Medicare Other

## 2022-11-08 VITALS — BP 110/68 | Ht 63.0 in | Wt 155.4 lb

## 2022-11-08 DIAGNOSIS — Z0000 Encounter for general adult medical examination without abnormal findings: Secondary | ICD-10-CM

## 2022-11-08 NOTE — Progress Notes (Signed)
Subjective:   Jack Kent is a 77 y.o. male who presents for Medicare Annual/Subsequent preventive examination.  Visit Complete: In person  Review of Systems     Cardiac Risk Factors include: advanced age (>19men, >5 women);male gender     Objective:    Today's Vitals   11/08/22 1416  BP: 110/68  Weight: 155 lb 6.4 oz (70.5 kg)  Height: 5\' 3"  (1.6 m)   Body mass index is 27.53 kg/m.     11/08/2022    2:21 PM 12/07/2021    8:12 AM 11/23/2021   11:32 AM  Advanced Directives  Does Patient Have a Medical Advance Directive? No No No  Would patient like information on creating a medical advance directive? No - Patient declined  No - Patient declined    Current Medications (verified) Outpatient Encounter Medications as of 11/08/2022  Medication Sig   acetaminophen (TYLENOL) 500 MG tablet Take 500 mg by mouth every 6 (six) hours as needed.   amLODipine (NORVASC) 10 MG tablet Take 1 tablet (10 mg total) by mouth daily.   gabapentin (NEURONTIN) 100 MG capsule Take 1 capsule (100 mg total) by mouth at bedtime as needed.   hydrochlorothiazide (HYDRODIURIL) 12.5 MG tablet Take 1 tablet (12.5 mg total) by mouth daily.   No facility-administered encounter medications on file as of 11/08/2022.    Allergies (verified) Declomycin [demeclocycline]   History: Past Medical History:  Diagnosis Date   Hypertension    Motion sickness    Boats   Past Surgical History:  Procedure Laterality Date   CATARACT EXTRACTION W/PHACO Right 11/23/2021   Procedure: CATARACT EXTRACTION PHACO AND INTRAOCULAR LENS PLACEMENT (IOC) RIGHT;  Surgeon: Galen Manila, MD;  Location: [facility];  Service: Ophthalmology;  Laterality: Right;  15.23 1:27.4   CATARACT EXTRACTION W/PHACO Left 12/07/2021   Procedure: CATARACT EXTRACTION PHACO AND INTRAOCULAR LENS PLACEMENT (IOC) LEFT 41.14 03:07.3;  Surgeon: Galen Manila, MD;  Location: [facility];  Service: Ophthalmology;   Laterality: Left;   History reviewed. No pertinent family history. Social History   Socioeconomic History   Marital status: Married    Spouse name: Not on file   Number of children: Not on file   Years of education: Not on file   Highest education level: Not on file  Occupational History   Not on file  Tobacco Use   Smoking status: Never   Smokeless tobacco: Former    Types: Chew   Tobacco comments:    Smoked and chewed some in [employer]  Vaping Use   Vaping status: Never Used  Substance and Sexual Activity   Alcohol use: Not Currently   Drug use: Never   Sexual activity: Not Currently  Other Topics Concern   Not on file  Social History Narrative   Not on file   Social Determinants of Health   Financial Resource Strain: Low Risk  (11/08/2022)   Overall Financial Resource Strain (CARDIA)    Difficulty of Paying Living Expenses: Not very hard  Food Insecurity: No Food Insecurity (11/08/2022)   Hunger Vital Sign    Worried About Running Out of Food in the Last Year: Never true    Ran Out of Food in the Last Year: Never true  Transportation Needs: No Transportation Needs (11/08/2022)   PRAPARE - Administrator, Civil Service (Medical): No    Lack of Transportation (Non-Medical): No  Physical Activity: Sufficiently Active (11/08/2022)   Exercise Vital Sign    Days  of Exercise per Week: 3 days    Minutes of Exercise per Session: 60 min  Stress: No Stress Concern Present (11/08/2022)   [employer] of Occupational Health - Occupational Stress Questionnaire    Feeling of Stress : Only a little  Social Connections: Moderately Isolated (11/08/2022)   Social Connection and Isolation Panel [NHANES]    Frequency of Communication with Friends and Family: More than three times a week    Frequency of Social Gatherings with Friends and Family: Twice a week    Attends Religious Services: Never    Database administrator or Organizations: No    Attends Museum/gallery exhibitions officer: Never    Marital Status: Married    Tobacco Counseling Counseling given: Not Answered Tobacco comments: Smoked and chewed some in [employer]   Clinical Intake:  Pre-visit preparation completed: Yes  Pain : No/denies pain     Nutritional Risks: None Diabetes: No  How often do you need to have someone help you when you read instructions, pamphlets, or other written materials from your doctor or pharmacy?: 1 - Never  Interpreter Needed?: No  Information entered by :: Kennedy Bucker, LPN   Activities of Daily Living    11/08/2022    2:23 PM 11/23/2021   11:21 AM  In your present state of health, do you have any difficulty performing the following activities:  Hearing? 1 0  Vision? 0 0  Difficulty concentrating or making decisions? 0 0  Walking or climbing stairs? 0 0  Dressing or bathing? 0 0  Doing errands, shopping? 0   Preparing Food and eating ? N   Using the Toilet? N   In the past six months, have you accidently leaked urine? N   Do you have problems with loss of bowel control? N   Managing your Medications? N   Managing your Finances? N   Housekeeping or managing your Housekeeping? N     Patient Care Team: Duanne Limerick, MD as PCP - General (Family Medicine)  Indicate any recent Medical Services you may have received from other than [facility] providers in the past year (date may be approximate).     Assessment:   This is a routine wellness examination for Jack Kent.  Hearing/Vision screen Hearing Screening - Comments:: Has aids, but worn out  Vision Screening - Comments:: Had cataract sgy, wears readers- [facility]  Dietary issues and exercise activities discussed:     Goals Addressed             This Visit's Progress    DIET - EAT MORE FRUITS AND VEGETABLES         Depression Screen    11/08/2022    2:19 PM 07/08/2022    1:46 PM 02/14/2022   11:01 AM 01/12/2022   11:18 AM 10/25/2021    3:18 PM 09/22/2021    1:47 PM  09/06/2021    2:06 PM  PHQ 2/9 Scores  PHQ - 2 Score 0 0 0 0 0 0 0  PHQ- 9 Score 0 0 0 0  0 0    Fall Risk    11/08/2022    2:22 PM 07/08/2022    1:46 PM 02/14/2022   11:01 AM 01/12/2022   11:18 AM 10/25/2021    3:17 PM  Fall Risk   Falls in the past year? 0 0 0 0 0  Number falls in past yr: 0 0 0 0 0  Injury with Fall? 0 0 0 0 0  Risk for fall due to : No Fall Risks No Fall Risks No Fall Risks No Fall Risks No Fall Risks  Follow up Falls prevention discussed;Falls evaluation completed Falls evaluation completed Falls evaluation completed Falls evaluation completed Falls evaluation completed    MEDICARE RISK AT HOME: Medicare Risk at Home Any stairs in or around the home?: Yes If so, are there any without handrails?: No Home free of loose throw rugs in walkways, pet beds, electrical cords, etc?: Yes Adequate lighting in your home to reduce risk of falls?: Yes Life alert?: No Use of a cane, walker or w/c?: No Grab bars in the bathroom?: No Shower chair or bench in shower?: No Elevated toilet seat or a handicapped toilet?: No  TIMED UP AND GO:  Was the test performed?  Yes  Length of time to ambulate 10 feet: 4 sec Gait steady and fast without use of assistive device    Cognitive Function:        11/08/2022    2:24 PM 10/25/2021    3:18 PM  6CIT Screen  What Year? 0 points 0 points  What month? 0 points 0 points  What time? 0 points 0 points  Count back from 20 0 points 0 points  Months in reverse 0 points 0 points  Repeat phrase 2 points 0 points  Total Score 2 points 0 points    Immunizations Immunization History  Administered Date(s) Administered   Fluad Quad(high Dose 65+) 01/20/2022   PNEUMOCOCCAL CONJUGATE-20 02/14/2022    TDAP status: Due, Education has been provided regarding the importance of this vaccine. Advised may receive this vaccine at local pharmacy or Health Dept. Aware to provide a copy of the vaccination record if obtained from local pharmacy  or Health Dept. Verbalized acceptance and understanding.  Flu Vaccine status: Up to date  Pneumococcal vaccine status: Up to date  Covid-19 vaccine status: Declined, Education has been provided regarding the importance of this vaccine but patient still declined. Advised may receive this vaccine at local pharmacy or Health Dept.or vaccine clinic. Aware to provide a copy of the vaccination record if obtained from local pharmacy or Health Dept. Verbalized acceptance and understanding.  Qualifies for Shingles Vaccine? Yes   Zostavax completed No   Shingrix Completed?: No.    Education has been provided regarding the importance of this vaccine. Patient has been advised to call insurance company to determine out of pocket expense if they have not yet received this vaccine. Advised may also receive vaccine at local pharmacy or Health Dept. Verbalized acceptance and understanding.  Screening Tests Health Maintenance  Topic Date Due   COVID-19 Vaccine (1) Never done   Hepatitis C Screening  Never done   Zoster Vaccines- Shingrix (1 of 2) Never done   INFLUENZA VACCINE  10/20/2022   Medicare Annual Wellness (AWV)  11/08/2023   Pneumonia Vaccine 69+ Years old  Completed   HPV VACCINES  Aged Out   DTaP/Tdap/Td  Discontinued    Health Maintenance  Health Maintenance Due  Topic Date Due   COVID-19 Vaccine (1) Never done   Hepatitis C Screening  Never done   Zoster Vaccines- Shingrix (1 of 2) Never done   INFLUENZA VACCINE  10/20/2022    Colorectal cancer screening: No longer required.   Lung Cancer Screening: (Low Dose CT Chest recommended if Age 59-80 years, 20 pack-year currently smoking OR have quit w/in 15years.) does not qualify.    Additional Screening:  Hepatitis C Screening: does qualify; Completed no  Vision Screening: Recommended annual ophthalmology exams for early detection of glaucoma and other disorders of the eye. Is the patient up to date with their annual eye exam?   Yes  Who is the provider or what is the name of the office in which the patient attends annual eye exams? [facility] If pt is not established with a provider, would they like to be referred to a provider to establish care? No .   Dental Screening: Recommended annual dental exams for proper oral hygiene   Community Resource Referral / Chronic Care Management: CRR required this visit?  No   CCM required this visit?  No     Plan:     I have personally reviewed and noted the following in the patient's chart:   Medical and social history Use of alcohol, tobacco or illicit drugs  Current medications and supplements including opioid prescriptions. Patient is not currently taking opioid prescriptions. Functional ability and status Nutritional status Physical activity Advanced directives List of other physicians Hospitalizations, surgeries, and ER visits in previous 12 months Vitals Screenings to include cognitive, depression, and falls Referrals and appointments  In addition, I have reviewed and discussed with patient certain preventive protocols, quality metrics, and best practice recommendations. A written personalized care plan for preventive services as well as general preventive health recommendations were provided to patient.     Hal Hope, LPN   [id number]   After Visit Summary:   Nurse Notes: none

## 2022-11-08 NOTE — Patient Instructions (Addendum)
Jack Kent , Thank you for taking time to come for your Medicare Wellness Visit. I appreciate your ongoing commitment to your health goals. Please review the following plan we discussed and let me know if I can assist you in the future.   Referrals/Orders/Follow-Ups/Clinician Recommendations: none  This is a list of the screening recommended for you and due dates:  Health Maintenance  Topic Date Due   COVID-19 Vaccine (1) Never done   Hepatitis C Screening  Never done   Zoster (Shingles) Vaccine (1 of 2) Never done   Flu Shot  10/20/2022   Medicare Annual Wellness Visit  11/08/2023   Pneumonia Vaccine  Completed   HPV Vaccine  Aged Out   DTaP/Tdap/Td vaccine  Discontinued    Advanced directives: (ACP Link)Information on Advanced Care Planning can be found at [facility] Advance Health Care Directives Advance Health Care Directives ([email or web address]   Next Medicare Annual Wellness Visit scheduled for next year: Yes  11/14/23 @ 1:00 pm in person  Preventive Care 65 Years and Older, Male  Preventive care refers to lifestyle choices and visits with your health care provider that can promote health and wellness. What does preventive care include? A yearly physical exam. This is also called an annual well check. Dental exams once or twice a year. Routine eye exams. Ask your health care provider how often you should have your eyes checked. Personal lifestyle choices, including: Daily care of your teeth and gums. Regular physical activity. Eating a healthy diet. Avoiding tobacco and drug use. Limiting alcohol use. Practicing safe sex. Taking low doses of aspirin every day. Taking vitamin and mineral supplements as recommended by your health care provider. What happens during an annual well check? The services and screenings done by your health care provider during your annual well check will depend on your age, overall health, lifestyle risk factors, and family history  of disease. Counseling  Your health care provider may ask you questions about your: Alcohol use. Tobacco use. Drug use. Emotional well-being. Home and relationship well-being. Sexual activity. Eating habits. History of falls. Memory and ability to understand (cognition). Work and work Astronomer. Screening  You may have the following tests or measurements: Height, weight, and BMI. Blood pressure. Lipid and cholesterol levels. These may be checked every 5 years, or more frequently if you are over 71 years old. Skin check. Lung cancer screening. You may have this screening every year starting at age 18 if you have a 30-pack-year history of smoking and currently smoke or have quit within the past 15 years. Fecal occult blood test (FOBT) of the stool. You may have this test every year starting at age 40. Flexible sigmoidoscopy or colonoscopy. You may have a sigmoidoscopy every 5 years or a colonoscopy every 10 years starting at age 33. Prostate cancer screening. Recommendations will vary depending on your family history and other risks. Hepatitis C blood test. Hepatitis B blood test. Sexually transmitted disease (STD) testing. Diabetes screening. This is done by checking your blood sugar (glucose) after you have not eaten for a while (fasting). You may have this done every 1-3 years. Abdominal aortic aneurysm (AAA) screening. You may need this if you are a current or former smoker. Osteoporosis. You may be screened starting at age 23 if you are at high risk. Talk with your health care provider about your test results, treatment options, and if necessary, the need for more tests. Vaccines  Your health care provider may recommend certain vaccines,  such as: Influenza vaccine. This is recommended every year. Tetanus, diphtheria, and acellular pertussis (Tdap, Td) vaccine. You may need a Td booster every 10 years. Zoster vaccine. You may need this after age 4. Pneumococcal 13-valent  conjugate (PCV13) vaccine. One dose is recommended after age 35. Pneumococcal polysaccharide (PPSV23) vaccine. One dose is recommended after age 28. Talk to your health care provider about which screenings and vaccines you need and how often you need them. This information is not intended to replace advice given to you by your health care provider. Make sure you discuss any questions you have with your health care provider. Document Released: 04/03/2015 Document Revised: 11/25/2015 Document Reviewed: 01/06/2015 Elsevier Interactive Patient Education  2017 [employer].  Fall Prevention in the Home Falls can cause injuries. They can happen to people of all ages. There are many things you can do to make your home safe and to help prevent falls. What can I do on the outside of my home? Regularly fix the edges of walkways and driveways and fix any cracks. Remove anything that might make you trip as you walk through a door, such as a raised step or threshold. Trim any bushes or trees on the path to your home. Use bright outdoor lighting. Clear any walking paths of anything that might make someone trip, such as rocks or tools. Regularly check to see if handrails are loose or broken. Make sure that both sides of any steps have handrails. Any raised decks and porches should have guardrails on the edges. Have any leaves, snow, or ice cleared regularly. Use sand or salt on walking paths during winter. Clean up any spills in your garage right away. This includes oil or grease spills. What can I do in the bathroom? Use night lights. Install grab bars by the toilet and in the tub and shower. Do not use towel bars as grab bars. Use non-skid mats or decals in the tub or shower. If you need to sit down in the shower, use a plastic, non-slip stool. Keep the floor dry. Clean up any water that spills on the floor as soon as it happens. Remove soap buildup in the tub or shower regularly. Attach bath mats  securely with double-sided non-slip rug tape. Do not have throw rugs and other things on the floor that can make you trip. What can I do in the bedroom? Use night lights. Make sure that you have a light by your bed that is easy to reach. Do not use any sheets or blankets that are too big for your bed. They should not hang down onto the floor. Have a firm chair that has side arms. You can use this for support while you get dressed. Do not have throw rugs and other things on the floor that can make you trip. What can I do in the kitchen? Clean up any spills right away. Avoid walking on wet floors. Keep items that you use a lot in easy-to-reach places. If you need to reach something above you, use a strong step stool that has a grab bar. Keep electrical cords out of the way. Do not use floor polish or wax that makes floors slippery. If you must use wax, use non-skid floor wax. Do not have throw rugs and other things on the floor that can make you trip. What can I do with my stairs? Do not leave any items on the stairs. Make sure that there are handrails on both sides of the stairs and  use them. Fix handrails that are broken or loose. Make sure that handrails are as long as the stairways. Check any carpeting to make sure that it is firmly attached to the stairs. Fix any carpet that is loose or worn. Avoid having throw rugs at the top or bottom of the stairs. If you do have throw rugs, attach them to the floor with carpet tape. Make sure that you have a light switch at the top of the stairs and the bottom of the stairs. If you do not have them, ask someone to add them for you. What else can I do to help prevent falls? Wear shoes that: Do not have high heels. Have rubber bottoms. Are comfortable and fit you well. Are closed at the toe. Do not wear sandals. If you use a stepladder: Make sure that it is fully opened. Do not climb a closed stepladder. Make sure that both sides of the stepladder  are locked into place. Ask someone to hold it for you, if possible. Clearly mark and make sure that you can see: Any grab bars or handrails. First and last steps. Where the edge of each step is. Use tools that help you move around (mobility aids) if they are needed. These include: Canes. Walkers. Scooters. Crutches. Turn on the lights when you go into a dark area. Replace any light bulbs as soon as they burn out. Set up your furniture so you have a clear path. Avoid moving your furniture around. If any of your floors are uneven, fix them. If there are any pets around you, be aware of where they are. Review your medicines with your doctor. Some medicines can make you feel dizzy. This can increase your chance of falling. Ask your doctor what other things that you can do to help prevent falls. This information is not intended to replace advice given to you by your health care provider. Make sure you discuss any questions you have with your health care provider. Document Released: 01/01/2009 Document Revised: 08/13/2015 Document Reviewed: 04/11/2014 Elsevier Interactive Patient Education  2017 [employer].

## 2023-01-09 ENCOUNTER — Ambulatory Visit (INDEPENDENT_AMBULATORY_CARE_PROVIDER_SITE_OTHER): Payer: Medicare Other | Admitting: Family Medicine

## 2023-01-09 ENCOUNTER — Encounter: Payer: Self-pay | Admitting: Family Medicine

## 2023-01-09 VITALS — BP 118/78 | HR 66 | Ht 63.0 in | Wt 157.0 lb

## 2023-01-09 DIAGNOSIS — Z23 Encounter for immunization: Secondary | ICD-10-CM

## 2023-01-09 DIAGNOSIS — E785 Hyperlipidemia, unspecified: Secondary | ICD-10-CM

## 2023-01-09 DIAGNOSIS — I10 Essential (primary) hypertension: Secondary | ICD-10-CM

## 2023-01-09 MED ORDER — AMLODIPINE BESYLATE 10 MG PO TABS: 10.0000 mg | ORAL_TABLET | Freq: Every day | ORAL | 1 refills | Status: DC

## 2023-01-09 MED ORDER — HYDROCHLOROTHIAZIDE 12.5 MG PO TABS: 12.5000 mg | ORAL_TABLET | Freq: Every day | ORAL | 1 refills | Status: DC

## 2023-01-09 NOTE — Patient Instructions (Addendum)

## 2023-01-09 NOTE — Progress Notes (Signed)
Date:  01/09/2023   Name:  Jack Kent   DOB:  August 29, 1945   MRN:  [id number]   Chief Complaint: Hypertension  Hypertension This is a chronic problem. The current episode started more than 1 year ago. The problem has been gradually improving since onset. The problem is controlled. Pertinent negatives include no anxiety, blurred vision, chest pain, headaches, malaise/fatigue, neck pain, orthopnea, palpitations, peripheral edema, PND, shortness of breath or sweats. Past treatments include diuretics and calcium channel blockers. The current treatment provides moderate improvement. There are no compliance problems.  There is no history of CAD/MI or CVA. There is no history of chronic renal disease, a hypertension causing med or renovascular disease.  Hyperlipidemia This is a chronic problem. The problem is uncontrolled. He has no history of chronic renal disease. Pertinent negatives include no chest pain or shortness of breath. Current antihyperlipidemic treatment includes diet change. The current treatment provides moderate improvement of lipids.    Lab Results  Component Value Date   NA 141 07/08/2022   K 3.4 (L) 07/08/2022   CO2 23 07/08/2022   GLUCOSE 103 (H) 07/08/2022   BUN 22 07/08/2022   CREATININE 1.16 07/08/2022   CALCIUM 9.4 07/08/2022   EGFR 66 07/08/2022   Lab Results  Component Value Date   CHOL 217 (H) 07/08/2022   HDL 33 (L) 07/08/2022   LDLCALC 130 (H) 07/08/2022   TRIG 304 (H) 07/08/2022   No results found for: "TSH" No results found for: "HGBA1C" No results found for: "WBC", "HGB", "HCT", "MCV", "PLT" Lab Results  Component Value Date   ALT 13 07/08/2022   AST 13 07/08/2022   ALKPHOS 80 07/08/2022   BILITOT 0.6 07/08/2022   No results found for: "25OHVITD2", "25OHVITD3", "VD25OH"   Review of Systems  Constitutional:  Negative for fatigue, malaise/fatigue and unexpected weight change.  HENT:  Negative for congestion and sore throat.   Eyes:   Negative for blurred vision.  Respiratory:  Negative for cough, choking, shortness of breath and wheezing.   Cardiovascular:  Negative for chest pain, palpitations, orthopnea, leg swelling and PND.  Gastrointestinal:  Negative for blood in stool.  Genitourinary:  Negative for difficulty urinating and hematuria.  Musculoskeletal:  Negative for neck pain.  Neurological:  Negative for headaches.    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   Rotator cuff tendinitis, left 09/22/2021   Biceps tendinitis of left shoulder 09/22/2021   Rotator cuff tendinitis, right 09/22/2021    Allergies  Allergen Reactions   Declomycin [Demeclocycline] Hives    Past Surgical History:  Procedure Laterality Date   CATARACT EXTRACTION W/PHACO Right 11/23/2021   Procedure: CATARACT EXTRACTION PHACO AND INTRAOCULAR LENS PLACEMENT (IOC) RIGHT;  Surgeon: Galen Manila, MD;  Location: [facility];  Service: Ophthalmology;  Laterality: Right;  15.23 1:27.4   CATARACT EXTRACTION W/PHACO Left 12/07/2021   Procedure: CATARACT EXTRACTION PHACO AND INTRAOCULAR LENS PLACEMENT (IOC) LEFT 41.14 03:07.3;  Surgeon: Galen Manila, MD;  Location: [facility];  Service: Ophthalmology;  Laterality: Left;    Social History   Tobacco Use   Smoking status: Never   Smokeless tobacco: Former    Types: Chew   Tobacco comments:    Smoked and chewed some in [employer] Use   Vaping status: Never Used  Substance Use Topics  Alcohol use: Not Currently   Drug use: Never     Medication list has been reviewed and updated.  Current Meds  Medication Sig   acetaminophen (TYLENOL) 500 MG tablet Take 500 mg by mouth every 6 (six) hours as needed.   amLODipine (NORVASC) 10 MG tablet Take 1 tablet (10 mg total) by mouth daily.   gabapentin  (NEURONTIN) 100 MG capsule Take 1 capsule (100 mg total) by mouth at bedtime as needed.   hydrochlorothiazide (HYDRODIURIL) 12.5 MG tablet Take 1 tablet (12.5 mg total) by mouth daily.       01/09/2023    1:28 PM 07/08/2022    1:47 PM 02/14/2022   11:02 AM 01/12/2022   11:18 AM  GAD 7 : Generalized Anxiety Score  Nervous, Anxious, on Edge 0 0 0 0  Control/stop worrying 0 0 0 0  Worry too much - different things 0 0 0 0  Trouble relaxing 0 0 0 0  Restless 0 0 0 0  Easily annoyed or irritable 0 0 0 0  Afraid - awful might happen 0 0 0 0  Total GAD 7 Score 0 0 0 0  Anxiety Difficulty Not difficult at all Not difficult at all Not difficult at all Not difficult at all       01/09/2023    1:27 PM 11/08/2022    2:19 PM 07/08/2022    1:46 PM  Depression screen PHQ 2/9  Decreased Interest 0 0 0  Down, Depressed, Hopeless 0 0 0  PHQ - 2 Score 0 0 0  Altered sleeping 0 0 0  Tired, decreased energy 0 0 0  Change in appetite 0 0 0  Feeling bad or failure about yourself  0 0 0  Trouble concentrating 0 0 0  Moving slowly or fidgety/restless 0 0 0  Suicidal thoughts 0 0 0  PHQ-9 Score 0 0 0  Difficult doing work/chores Not difficult at all Not difficult at all Not difficult at all    BP Readings from Last 3 Encounters:  01/09/23 118/78  11/08/22 110/68  07/08/22 120/64    Physical Exam Vitals and nursing note reviewed.  HENT:     Head: Normocephalic.     Right Ear: External ear normal.     Left Ear: External ear normal.     Nose: Nose normal.     Mouth/Throat:     Mouth: Mucous membranes are moist.  Eyes:     General: No scleral icterus.       Right eye: No discharge.        Left eye: No discharge.     Conjunctiva/sclera: Conjunctivae normal.     Pupils: Pupils are equal, round, and reactive to light.  Neck:     Thyroid: No thyromegaly.     Vascular: No JVD.     Trachea: No tracheal deviation.  Cardiovascular:     Rate and Rhythm: Normal rate and regular rhythm.      Heart sounds: Normal heart sounds, S1 normal and S2 normal. No murmur heard.    No systolic murmur is present.     No diastolic murmur is present.     No friction rub. No gallop.  Pulmonary:     Effort: No respiratory distress.     Breath sounds: Normal breath sounds. No wheezing or rales.  Abdominal:     General: Bowel sounds are normal.     Palpations: Abdomen is soft. There is no mass.     Tenderness:  There is no abdominal tenderness. There is no guarding or rebound.  Musculoskeletal:        General: No tenderness. Normal range of motion.     Cervical back: Normal range of motion and neck supple.  Lymphadenopathy:     Cervical: No cervical adenopathy.  Skin:    General: Skin is warm.     Findings: No rash.  Neurological:     Mental Status: He is alert and oriented to person, place, and time.     Cranial Nerves: No cranial nerve deficit.     Deep Tendon Reflexes: Reflexes are normal and symmetric.     Wt Readings from Last 3 Encounters:  01/09/23 157 lb (71.2 kg)  11/08/22 155 lb 6.4 oz (70.5 kg)  07/08/22 159 lb (72.1 kg)    BP 118/78   Pulse 66   Ht 5\' 3"  (1.6 m)   Wt 157 lb (71.2 kg)   SpO2 98%   BMI 27.81 kg/m   Assessment and Plan:  1. Primary hypertension Chronic.  Controlled.  Stable.  Blood pressure today 118/78.  Asymptomatic.  Tolerating medication well.  Without side effects.  Continue amlodipine 10 mg once a day and hydrochlorothiazide 12.5 mg once a day.  Will check renal function panel in the event that his potassium was a little low in the last measurement. - amLODipine (NORVASC) 10 MG tablet; Take 1 tablet (10 mg total) by mouth daily.  Dispense: 90 tablet; Refill: 1 - hydrochlorothiazide (HYDRODIURIL) 12.5 MG tablet; Take 1 tablet (12.5 mg total) by mouth daily.  Dispense: 90 tablet; Refill: 1 - Renal Function Panel  2. Need for influenza vaccination Discussed and administered - Flu Vaccine Trivalent High Dose (Fluad)  3. Dyslipidemia Chronic.   Diet controlled.  Stable.  Patient is fasting and we will recheck his lipids and that both triglycerides and cholesterols were elevated.  I have reemphasized a low-cholesterol low triglyceride dietary guidelines the patient will be provided with on discharge. - Lipid Panel With LDL/HDL Ratio    Elizabeth Sauer, MD

## 2023-01-10 LAB — RENAL FUNCTION PANEL
Albumin: 4.4 g/dL (ref 3.8–4.8)
BUN/Creatinine Ratio: 19 (ref 10–24)
BUN: 18 mg/dL (ref 8–27)
CO2: 23 mmol/L (ref 20–29)
Calcium: 9.2 mg/dL (ref 8.6–10.2)
Chloride: 101 mmol/L (ref 96–106)
Creatinine, Ser: 0.97 mg/dL (ref 0.76–1.27)
Glucose: 87 mg/dL (ref 70–99)
Phosphorus: 2.4 mg/dL — ABNORMAL LOW (ref 2.8–4.1)
Potassium: 2.9 mmol/L — ABNORMAL LOW (ref 3.5–5.2)
Sodium: 142 mmol/L (ref 134–144)
eGFR: 81 mL/min/{1.73_m2} (ref >59)

## 2023-01-10 LAB — LIPID PANEL WITH LDL/HDL RATIO
Cholesterol, Total: 197 mg/dL (ref 100–199)
HDL: 30 mg/dL — ABNORMAL LOW (ref >39)
LDL Chol Calc (NIH): 125 mg/dL — ABNORMAL HIGH (ref 0–99)
LDL/HDL Ratio: 4.2 ratio — ABNORMAL HIGH (ref 0.0–3.6)
Triglycerides: 234 mg/dL — ABNORMAL HIGH (ref 0–149)
VLDL Cholesterol Cal: 42 mg/dL — ABNORMAL HIGH (ref 5–40)

## 2023-06-22 ENCOUNTER — Ambulatory Visit (INDEPENDENT_AMBULATORY_CARE_PROVIDER_SITE_OTHER): Payer: Self-pay | Admitting: Family Medicine

## 2023-06-22 ENCOUNTER — Encounter: Payer: Self-pay | Admitting: Family Medicine

## 2023-06-22 VITALS — BP 132/80 | HR 73 | Ht 63.0 in

## 2023-06-22 DIAGNOSIS — I10 Essential (primary) hypertension: Secondary | ICD-10-CM | POA: Diagnosis not present

## 2023-06-22 DIAGNOSIS — E785 Hyperlipidemia, unspecified: Secondary | ICD-10-CM

## 2023-06-22 DIAGNOSIS — E876 Hypokalemia: Secondary | ICD-10-CM

## 2023-06-22 MED ORDER — AMLODIPINE BESYLATE 10 MG PO TABS: 10.0000 mg | ORAL_TABLET | Freq: Every day | ORAL | 1 refills | Status: DC

## 2023-06-22 MED ORDER — HYDROCHLOROTHIAZIDE 12.5 MG PO TABS: 12.5000 mg | ORAL_TABLET | Freq: Every day | ORAL | 1 refills | Status: DC

## 2023-06-22 NOTE — Progress Notes (Signed)
 Date:  06/22/2023   Name:  Jack Kent   DOB:  Jun 23, 1945   MRN:  [id number]   Chief Complaint: Hypertension (Patient presents today for a follow up on his HTN. He has been doing well and taking his medications as directed. )  Hypertension This is a chronic problem. The current episode started more than 1 year ago. The problem has been gradually improving since onset. The problem is controlled. Pertinent negatives include no anxiety, blurred vision, chest pain, headaches, malaise/fatigue, neck pain, orthopnea, palpitations, peripheral edema, PND, shortness of breath or sweats. There are no associated agents to hypertension. Past treatments include calcium channel blockers and diuretics. The current treatment provides moderate improvement. There are no compliance problems.  There is no history of CAD/MI or CVA. There is no history of chronic renal disease, a hypertension causing med or renovascular disease.    Lab Results  Component Value Date   NA 142 01/09/2023   K 2.9 (L) 01/09/2023   CO2 23 01/09/2023   GLUCOSE 87 01/09/2023   BUN 18 01/09/2023   CREATININE 0.97 01/09/2023   CALCIUM 9.2 01/09/2023   EGFR 81 01/09/2023   Lab Results  Component Value Date   CHOL 197 01/09/2023   HDL 30 (L) 01/09/2023   LDLCALC 125 (H) 01/09/2023   TRIG 234 (H) 01/09/2023   No results found for: "TSH" No results found for: "HGBA1C" No results found for: "WBC", "HGB", "HCT", "MCV", "PLT" Lab Results  Component Value Date   ALT 13 07/08/2022   AST 13 07/08/2022   ALKPHOS 80 07/08/2022   BILITOT 0.6 07/08/2022   No results found for: "25OHVITD2", "25OHVITD3", "VD25OH"   Review of Systems  Constitutional:  Negative for malaise/fatigue.  Eyes:  Negative for blurred vision, redness and visual disturbance.  Respiratory:  Negative for apnea, cough, choking, chest tightness, shortness of breath, wheezing and stridor.   Cardiovascular:  Negative for chest pain, palpitations, orthopnea, leg  swelling and PND.  Musculoskeletal:  Negative for neck pain.  Neurological:  Negative for headaches.    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   Rotator cuff tendinitis, left 09/22/2021   Biceps tendinitis of left shoulder 09/22/2021   Rotator cuff tendinitis, right 09/22/2021    Allergies  Allergen Reactions   Declomycin [Demeclocycline] Hives    Past Surgical History:  Procedure Laterality Date   CATARACT EXTRACTION W/PHACO Right 11/23/2021   Procedure: CATARACT EXTRACTION PHACO AND INTRAOCULAR LENS PLACEMENT (IOC) RIGHT;  Surgeon: Galen Manila, MD;  Location: [facility];  Service: Ophthalmology;  Laterality: Right;  15.23 1:27.4   CATARACT EXTRACTION W/PHACO Left 12/07/2021   Procedure: CATARACT EXTRACTION PHACO AND INTRAOCULAR LENS PLACEMENT (IOC) LEFT 41.14 03:07.3;  Surgeon: Galen Manila, MD;  Location: [facility];  Service: Ophthalmology;  Laterality: Left;    Social History   Tobacco Use   Smoking status: Never   Smokeless tobacco: Former    Types: Chew   Tobacco comments:    Smoked and chewed some in [employer] Use   Vaping status: Never Used  Substance Use Topics   Alcohol use: Not Currently   Drug use: Never     Medication list has been reviewed and updated.  Current Meds  Medication Sig   amLODipine (NORVASC) 10 MG tablet Take 1 tablet (10 mg total) by  mouth daily.   hydrochlorothiazide (HYDRODIURIL) 12.5 MG tablet Take 1 tablet (12.5 mg total) by mouth daily.       06/22/2023    1:35 PM 01/09/2023    1:28 PM 07/08/2022    1:47 PM 02/14/2022   11:02 AM  GAD 7 : Generalized Anxiety Score  Nervous, Anxious, on Edge 0 0 0 0  Control/stop worrying 2 0 0 0  Worry too much - different things 0 0 0 0  Trouble relaxing 0 0 0 0  Restless 0 0 0 0  Easily annoyed or  irritable 0 0 0 0  Afraid - awful might happen 2 0 0 0  Total GAD 7 Score 4 0 0 0  Anxiety Difficulty Somewhat difficult Not difficult at all Not difficult at all Not difficult at all       06/22/2023    1:35 PM 01/09/2023    1:27 PM 11/08/2022    2:19 PM  Depression screen PHQ 2/9  Decreased Interest 0 0 0  Down, Depressed, Hopeless 0 0 0  PHQ - 2 Score 0 0 0  Altered sleeping 0 0 0  Tired, decreased energy 0 0 0  Change in appetite 0 0 0  Feeling bad or failure about yourself  0 0 0  Trouble concentrating 0 0 0  Moving slowly or fidgety/restless 0 0 0  Suicidal thoughts 0 0 0  PHQ-9 Score 0 0 0  Difficult doing work/chores Not difficult at all Not difficult at all Not difficult at all    BP Readings from Last 3 Encounters:  06/22/23 132/80  01/09/23 118/78  11/08/22 110/68    Physical Exam Vitals and nursing note reviewed.  Constitutional:      Appearance: He is well-developed.  HENT:     Head: Normocephalic and atraumatic.     Right Ear: Tympanic membrane, ear canal and external ear normal.     Left Ear: Tympanic membrane, ear canal and external ear normal.     Nose: Nose normal.     Mouth/Throat:     Mouth: Mucous membranes are moist.     Dentition: Normal dentition.  Eyes:     General: Lids are normal. No scleral icterus.    Conjunctiva/sclera: Conjunctivae normal.     Pupils: Pupils are equal, round, and reactive to light.  Neck:     Thyroid: No thyromegaly.     Vascular: No carotid bruit, hepatojugular reflux or JVD.     Trachea: No tracheal deviation.  Cardiovascular:     Rate and Rhythm: Normal rate and regular rhythm.     Heart sounds: Normal heart sounds. No murmur heard.    No friction rub. No gallop.  Pulmonary:     Effort: Pulmonary effort is normal.     Breath sounds: Normal breath sounds. No wheezing, rhonchi or rales.  Abdominal:     General: Bowel sounds are normal. There is no distension.     Palpations: Abdomen is soft. There is no  hepatomegaly, splenomegaly or mass.     Tenderness: There is no abdominal tenderness. There is no right CVA tenderness, guarding or rebound.     Hernia: There is no hernia in the left inguinal area.  Musculoskeletal:        General: Normal range of motion.     Cervical back: Normal range of motion and neck supple.  Lymphadenopathy:     Cervical: No cervical adenopathy.  Skin:    General: Skin is warm and dry.  Findings: No rash.  Neurological:     Mental Status: He is alert and oriented to person, place, and time.     Sensory: No sensory deficit.     Deep Tendon Reflexes: Reflexes are normal and symmetric.  Psychiatric:        Mood and Affect: Mood is not anxious or depressed.     Wt Readings from Last 3 Encounters:  01/09/23 157 lb (71.2 kg)  11/08/22 155 lb 6.4 oz (70.5 kg)  07/08/22 159 lb (72.1 kg)    BP 132/80   Pulse 73   Ht 5\' 3"  (1.6 m)   SpO2 97%   BMI 27.81 kg/m   Assessment and Plan: 1. Primary hypertension (Primary) Chronic.  Controlled.  Stable.  Blood pressure today is 132/80.  Asymptomatic.  Tolerating current medications well.  We will continue amlodipine 10 mg once a day and hydrochlorothiazide 12.5 mg once a day.  Will check renal function panel for electrolytes and GFR.  Will recheck patient in 6 months. - amLODipine (NORVASC) 10 MG tablet; Take 1 tablet (10 mg total) by mouth daily.  Dispense: 90 tablet; Refill: 1 - hydrochlorothiazide (HYDRODIURIL) 12.5 MG tablet; Take 1 tablet (12.5 mg total) by mouth daily.  Dispense: 90 tablet; Refill: 1 - Renal Function Panel  2. Hypokalemia New onset.  Patient had noted a potassium of 2.9 on last visit.  We will recheck again if remains decreased we will likely supplement. - Renal Function Panel  3. Dyslipidemia Chronic.  Diet controlled.  Stable.  Patient has been given low-cholesterol low triglyceride dietary guidelines.  Patient has been encouraged to make proper choices only at this dietary intake and we  will recheck lipid panel in 6 months and likely be in position to assist with a statin if it remains elevated above 130.    Elizabeth Sauer, MD

## 2023-06-22 NOTE — Patient Instructions (Signed)

## 2023-06-23 ENCOUNTER — Telehealth: Payer: Self-pay

## 2023-06-23 LAB — RENAL FUNCTION PANEL
Albumin: 4.4 g/dL (ref 3.8–4.8)
BUN/Creatinine Ratio: 17 (ref 10–24)
BUN: 19 mg/dL (ref 8–27)
CO2: 23 mmol/L (ref 20–29)
Calcium: 9.5 mg/dL (ref 8.6–10.2)
Chloride: 103 mmol/L (ref 96–106)
Creatinine, Ser: 1.15 mg/dL (ref 0.76–1.27)
Glucose: 97 mg/dL (ref 70–99)
Phosphorus: 4.2 mg/dL — ABNORMAL HIGH (ref 2.8–4.1)
Potassium: 3.7 mmol/L (ref 3.5–5.2)
Sodium: 142 mmol/L (ref 134–144)
eGFR: 66 mL/min/{1.73_m2} (ref >59)

## 2023-06-23 NOTE — Telephone Encounter (Signed)
 Copied from CRM [id number]. Topic: Clinical - Lab/Test Results >> Jun 23, 2023  9:28 AM Izetta Dakin wrote: Reason for CRM: Patient requesting lab results. Relayed information verbatim, patient verbalized understanding, no additional questions at this time

## 2023-11-13 ENCOUNTER — Telehealth: Payer: Self-pay | Admitting: Family Medicine

## 2023-11-13 NOTE — Telephone Encounter (Signed)
 Copied from CRM [id number]. Topic: Medicare AWV >> Nov 13, 2023  9:19 AM Nathanel DEL wrote: Reason for CRM: LM 11/13/23 to change AWV appt from in person to telephone due to [facility] working remote  [employer]; Care Guide Ambulatory Clinical Support  l [facility] Direct Dial: [phone]

## 2023-11-16 ENCOUNTER — Ambulatory Visit (INDEPENDENT_AMBULATORY_CARE_PROVIDER_SITE_OTHER): Admitting: Family Medicine

## 2023-11-16 ENCOUNTER — Encounter: Payer: Self-pay | Admitting: Family Medicine

## 2023-11-16 ENCOUNTER — Other Ambulatory Visit (INDEPENDENT_AMBULATORY_CARE_PROVIDER_SITE_OTHER): Payer: Self-pay | Admitting: Radiology

## 2023-11-16 VITALS — BP 120/60 | HR 70 | Ht 63.0 in | Wt 151.0 lb

## 2023-11-16 DIAGNOSIS — M65331 Trigger finger, right middle finger: Secondary | ICD-10-CM

## 2023-11-16 DIAGNOSIS — M19031 Primary osteoarthritis, right wrist: Secondary | ICD-10-CM

## 2023-11-16 MED ORDER — TRIAMCINOLONE ACETONIDE 40 MG/ML IJ SUSP
60.0000 mg | Freq: Once | INTRAMUSCULAR | Status: AC
  Administered 2023-11-16: 60 mg via INTRAMUSCULAR

## 2023-11-16 MED ORDER — MELOXICAM 7.5 MG PO TABS: 7.5000 mg | ORAL_TABLET | Freq: Every day | ORAL | 0 refills | Status: AC | PRN

## 2023-11-16 NOTE — Progress Notes (Signed)
 Primary Care / Sports Medicine Office Visit  Patient Information:  Patient ID: Jack Kent, male DOB: 1945-10-29 Age: 78 y.o. MRN: [id number]   Jack Kent is a pleasant 78 y.o. male presenting with the following:  Chief Complaint  Patient presents with   Hand Pain    Right hand long finger locking up,right hand having difficulty gripping and grasping. Patient has been wearing a brace which helps sometimes.     Vitals:   11/16/23 1007  BP: 120/60  Pulse: 70  SpO2: 99%   Vitals:   11/16/23 1007  Weight: 151 lb (68.5 kg)  Height: 5' 3 (1.6 m)   Body mass index is 26.75 kg/m.  No results found.   Independent interpretation of notes and tests performed by another provider:   None  Procedures performed:   Procedure:  Injection of wrist under ultrasound guidance. Ultrasound guidance utilized for dorsal approach to the radiocarpal junction, joint space visualized Samsung HS60 device utilized with permanent recording / reporting. Verbal informed consent obtained and verified. Skin prepped in a sterile fashion. Ethyl chloride for topical local analgesia.  Completed without difficulty and tolerated well. Medication: triamcinolone  acetonide 40 mg/mL suspension for injection 1 mL total and 2 mL lidocaine  1% without epinephrine  utilized for needle placement anesthetic Advised to contact for fevers/chills, erythema, induration, drainage, or persistent bleeding.  Procedure:  Injection of right third finger under ultrasound guidance. Ultrasound guidance utilized for out of plane approach to the right third digit flexor tendon, peritendinous region, sonographic evidence of thickening noted Samsung HS60 device utilized with permanent recording / reporting. Verbal informed consent obtained and verified. Skin prepped in a sterile fashion. Ethyl chloride for topical local analgesia.  Completed without difficulty and tolerated well. Medication: triamcinolone  acetonide  40 mg/mL suspension for injection 0.5 mL total and 0.25 mL lidocaine  1% without epinephrine  utilized for needle placement anesthetic Advised to contact for fevers/chills, erythema, induration, drainage, or persistent bleeding.   Pertinent History, Exam, Impression, and Recommendations:   Problem List Items Addressed This Visit     Primary osteoarthritis of right wrist   See additional assessment(s) for history, exam, and further details.   Right wrist primary osteoarthritis Chronic right wrist pain with concern for underlying primary osteoarthritis with recent exacerbation.  Has comorbid history of carpal tunnel which appears to be asymptomatic currently.  However significant tenderness at the distal radioulnar joint (DRUJ) and swelling. No discoloration, erythema, or ecchymosis. Increased use of left hand due to decreased function in the right hand. Current symptoms due to joint inflammation. - Administer ultrasound-guided cortisone injection to the right wrist. - Advise wearing a wrist brace day and night for two days, then return to nighttime use only. - Recommend icing the wrist for 20 minutes as tolerated to manage post-injection discomfort. - Prescribe as-needed medication for pain and inflammation, (meloxicam  7.5 mg) to be used for future episodes or if symptoms worsen.  Take this medication with food on an as-needed basis. - Advise use of Tylenol every 4-6 hours as needed for pain control.      Relevant Medications   meloxicam  (MOBIC ) 7.5 MG tablet   Trigger finger, right middle finger - Primary   Right hand trigger finger symptoms - Worsening symptoms in the right hand over the past 1-2 months, primarily affecting the middle finger - This is an acute on chronic concern with trigger finger presence in the past, last treated 2 years prior - Middle finger becomes 'locked up' with  associated soreness - Intermittent improvement, but overall worsening over the past 1-2 months -  Numbness or tingling at the tip of the middle finger - No widespread numbness or tingling in the hand  Physical Exam RIGHT HAND / WRIST INSPECTION: Swelling present around the wrist; no skin discoloration, erythema, or ecchymosis PALPATION: Significant tenderness at the distal radioulnar joint; palpable tenderness and nodularity at the third metacarpophalangeal region consistent with trigger finger RANGE OF MOTION: Wrist flexion and extension limited by discomfort; finger flexion intact, with catching/locking noted at the third digit consistent with triggering STRENGTH: Grip strength mildly reduced on the right due to pain; otherwise 5/5 strength in remaining digits and wrist movements NEUROLOGICAL: Sensation intact to light touch throughout radial, ulnar, and median distributions; no motor deficits SPECIAL TESTS: Finkelstein's test negative; resisted middle finger flexion reproduces triggering symptoms at the third MCP  Right middle finger trigger finger Right middle finger trigger finger with palpable tenderness and nodularity at the third MCP region.  Acute on chronic symptoms worsened over the past month or two, with occasional locking of the finger. Soreness and a small lump at the site. Previous conservative management was effective, but current symptoms necessitate intervention. - Administer steroid injection to the right middle finger under ultrasound guidance today. - Recommend icing the hand to manage post-injection discomfort. - Advise use of Tylenol as needed for pain control. - Can dose meloxicam  daily on an as-needed basis for current symptoms and any future symptoms.  Take with food.      Relevant Medications   meloxicam  (MOBIC ) 7.5 MG tablet   Other Relevant Orders   US  LIMITED JOINT SPACE STRUCTURES UP RIGHT     Orders & Medications Medications:  Meds ordered this encounter  Medications   triamcinolone  acetonide (KENALOG -40) injection 60 mg   meloxicam  (MOBIC ) 7.5 MG  tablet    Sig: Take 1 tablet (7.5 mg total) by mouth daily as needed for pain.    Dispense:  30 tablet    Refill:  0   Orders Placed This Encounter  Procedures   US  LIMITED JOINT SPACE STRUCTURES UP RIGHT     No follow-ups on file.     Selinda JINNY Ku, MD, [facility]   Primary Care Sports Medicine Primary Care and Sports Medicine at [facility]

## 2023-11-16 NOTE — Assessment & Plan Note (Addendum)
 Right hand trigger finger symptoms - Worsening symptoms in the right hand over the past 1-2 months, primarily affecting the middle finger - This is an acute on chronic concern with trigger finger presence in the past, last treated 2 years prior - Middle finger becomes 'locked up' with associated soreness - Intermittent improvement, but overall worsening over the past 1-2 months - Numbness or tingling at the tip of the middle finger - No widespread numbness or tingling in the hand  Physical Exam RIGHT HAND / WRIST INSPECTION: Swelling present around the wrist; no skin discoloration, erythema, or ecchymosis PALPATION: Significant tenderness at the distal radioulnar joint; palpable tenderness and nodularity at the third metacarpophalangeal region consistent with trigger finger RANGE OF MOTION: Wrist flexion and extension limited by discomfort; finger flexion intact, with catching/locking noted at the third digit consistent with triggering STRENGTH: Grip strength mildly reduced on the right due to pain; otherwise 5/5 strength in remaining digits and wrist movements NEUROLOGICAL: Sensation intact to light touch throughout radial, ulnar, and median distributions; no motor deficits SPECIAL TESTS: Finkelstein's test negative; resisted middle finger flexion reproduces triggering symptoms at the third MCP  Right middle finger trigger finger Right middle finger trigger finger with palpable tenderness and nodularity at the third MCP region.  Acute on chronic symptoms worsened over the past month or two, with occasional locking of the finger. Soreness and a small lump at the site. Previous conservative management was effective, but current symptoms necessitate intervention. - Administer steroid injection to the right middle finger under ultrasound guidance today. - Recommend icing the hand to manage post-injection discomfort. - Advise use of Tylenol as needed for pain control. - Can dose meloxicam  daily on  an as-needed basis for current symptoms and any future symptoms.  Take with food.

## 2023-11-16 NOTE — Patient Instructions (Signed)
 Patient Plan for Post-Visit Guidance  Right Wrist Osteoarthritis - Cortisone injection given to the right wrist. - Wear a wrist brace day and night for two days, then switch to nighttime use only. - Ice the wrist for 20 minutes as tolerated to help with post-injection discomfort. - Take meloxicam  7.5 mg as needed for pain or inflammation. Always take with food. - Use Tylenol every 4-6 hours as needed for pain.  Right Middle Finger Trigger Finger - Steroid injection given to the right middle finger. - Ice the hand as needed to manage post-injection discomfort. - Use Tylenol as needed for pain. - Meloxicam  can be taken daily as needed for current or future symptoms. Take with food.  Red Flags - If you experience severe pain, sudden swelling, redness, warmth, fever, inability to move the wrist or finger, or any new or worsening symptoms, seek medical attention promptly.

## 2023-11-16 NOTE — Assessment & Plan Note (Signed)
 See additional assessment(s) for history, exam, and further details.   Right wrist primary osteoarthritis Chronic right wrist pain with concern for underlying primary osteoarthritis with recent exacerbation.  Has comorbid history of carpal tunnel which appears to be asymptomatic currently.  However significant tenderness at the distal radioulnar joint (DRUJ) and swelling. No discoloration, erythema, or ecchymosis. Increased use of left hand due to decreased function in the right hand. Current symptoms due to joint inflammation. - Administer ultrasound-guided cortisone injection to the right wrist. - Advise wearing a wrist brace day and night for two days, then return to nighttime use only. - Recommend icing the wrist for 20 minutes as tolerated to manage post-injection discomfort. - Prescribe as-needed medication for pain and inflammation, (meloxicam  7.5 mg) to be used for future episodes or if symptoms worsen.  Take this medication with food on an as-needed basis. - Advise use of Tylenol every 4-6 hours as needed for pain control.

## 2023-11-29 ENCOUNTER — Ambulatory Visit: Admitting: Emergency Medicine

## 2023-11-29 VITALS — Ht 64.0 in | Wt 151.0 lb

## 2023-11-29 DIAGNOSIS — Z0000 Encounter for general adult medical examination without abnormal findings: Secondary | ICD-10-CM | POA: Diagnosis not present

## 2023-11-29 NOTE — Patient Instructions (Signed)
 Jack Kent,  Thank you for taking the time for your Medicare Wellness Visit. I appreciate your continued commitment to your health goals. Please review the care plan we discussed, and feel free to reach out if I can assist you further.  Medicare recommends these wellness visits once per year to help you and your care team stay ahead of potential health issues. These visits are designed to focus on prevention, allowing your provider to concentrate on managing your acute and chronic conditions during your regular appointments.  Please note that Annual Wellness Visits do not include a physical exam. Some assessments may be limited, especially if the visit was conducted virtually. If needed, we may recommend a separate in-person follow-up with your provider.  Ongoing Care Seeing your primary care provider every 3 to 6 months helps us  monitor your health and provide consistent, personalized care.   Referrals If a referral was made during today's visit and you haven't received any updates within two weeks, please contact the referred provider directly to check on the status.  Recommended Screenings: Get the flu vaccine at your next OV on 12/25/23.  Health Maintenance  Topic Date Due   COVID-19 Vaccine (1) Never done   Hepatitis C Screening  Never done   Zoster (Shingles) Vaccine (1 of 2) Never done   Flu Shot  10/20/2023   Medicare Annual Wellness Visit  11/28/2024   Pneumococcal Vaccine for age over 20  Completed   HPV Vaccine  Aged Out   Meningitis B Vaccine  Aged Out   DTaP/Tdap/Td vaccine  Discontinued       11/29/2023    3:43 PM  Advanced Directives  Does Patient Have a Medical Advance Directive? No  Would patient like information on creating a medical advance directive? Yes (MAU/Ambulatory/Procedural Areas - Information given)   Advance Care Planning is important because it: Ensures you receive medical care that aligns with your values, goals, and preferences. Provides guidance  to your family and loved ones, reducing the emotional burden of decision-making during critical moments.  Vision: Annual vision screenings are recommended for early detection of glaucoma, cataracts, and diabetic retinopathy. These exams can also reveal signs of chronic conditions such as diabetes and high blood pressure.  Dental: Annual dental screenings help detect early signs of oral cancer, gum disease, and other conditions linked to overall health, including heart disease and diabetes.  Please see the attached documents for additional preventive care recommendations.   Fall Prevention in the Home, Adult Falls can cause injuries and affect people of all ages. There are many simple things that you can do to make your home safe and to help prevent falls. If you need it, ask for help making these changes. What actions can I take to prevent falls? General information Use good lighting in all rooms. Make sure to: Replace any light bulbs that burn out. Turn on lights if it is dark and use night-lights. Keep items that you use often in easy-to-reach places. Lower the shelves around your home if needed. Move furniture so that there are clear paths around it. Do not keep throw rugs or other things on the floor that can make you trip. If any of your floors are uneven, fix them. Add color or contrast paint or tape to clearly mark and help you see: Grab bars or handrails. First and last steps of staircases. Where the edge of each step is. If you use a ladder or stepladder: Make sure that it is fully opened. Do  not climb a closed ladder. Make sure the sides of the ladder are locked in place. Have someone hold the ladder while you use it. Know where your pets are as you move through your home. What can I do in the bathroom?     Keep the floor dry. Clean up any water that is on the floor right away. Remove soap buildup in the bathtub or shower. Buildup makes bathtubs and showers slippery. Use  non-skid mats or decals on the floor of the bathtub or shower. Attach bath mats securely with double-sided, non-slip rug tape. If you need to sit down while you are in the shower, use a non-slip stool. Install grab bars by the toilet and in the bathtub and shower. Do not use towel bars as grab bars. What can I do in the bedroom? Make sure that you have a light by your bed that is easy to reach. Do not use any sheets or blankets on your bed that hang to the floor. Have a firm bench or chair with side arms that you can use for support when you get dressed. What can I do in the kitchen? Clean up any spills right away. If you need to reach something above you, use a sturdy step stool that has a grab bar. Keep electrical cables out of the way. Do not use floor polish or wax that makes floors slippery. What can I do with my stairs? Do not leave anything on the stairs. Make sure that you have a light switch at the top and the bottom of the stairs. Have them installed if you do not have them. Make sure that there are handrails on both sides of the stairs. Fix handrails that are broken or loose. Make sure that handrails are as long as the staircases. Install non-slip stair treads on all stairs in your home if they do not have carpet. Avoid having throw rugs at the top or bottom of stairs, or secure the rugs with carpet tape to prevent them from moving. Choose a carpet design that does not hide the edge of steps on the stairs. Make sure that carpet is firmly attached to the stairs. Fix any carpet that is loose or worn. What can I do on the outside of my home? Use bright outdoor lighting. Repair the edges of walkways and driveways and fix any cracks. Clear paths of anything that can make you trip, such as tools or rocks. Add color or contrast paint or tape to clearly mark and help you see high doorway thresholds. Trim any bushes or trees on the main path into your home. Check that handrails are  securely fastened and in good repair. Both sides of all steps should have handrails. Install guardrails along the edges of any raised decks or porches. Have leaves, snow, and ice cleared regularly. Use sand, salt, or ice melt on walkways during winter months if you live where there is ice and snow. In the garage, clean up any spills right away, including grease or oil spills. What other actions can I take? Review your medicines with your health care provider. Some medicines can make you confused or feel dizzy. This can increase your chance of falling. Wear closed-toe shoes that fit well and support your feet. Wear shoes that have rubber soles and low heels. Use a cane, walker, scooter, or crutches that help you move around if needed. Talk with your provider about other ways that you can decrease your risk of falls. This may  include seeing a physical therapist to learn to do exercises to improve movement and strength. Where to find more information Centers for Disease Control and Prevention, STEADI: TonerPromos.no [employer] on Aging: BaseRingTones.pl National Institute on Aging: BaseRingTones.pl Contact a health care provider if: You are afraid of falling at home. You feel weak, drowsy, or dizzy at home. You fall at home. Get help right away if you: Lose consciousness or have trouble moving after a fall. Have a fall that causes a head injury. These symptoms may be an emergency. Get help right away. Call 911. Do not wait to see if the symptoms will go away. Do not drive yourself to the hospital. This information is not intended to replace advice given to you by your health care provider. Make sure you discuss any questions you have with your health care provider. Document Revised: 11/08/2021 Document Reviewed: 11/08/2021 Elsevier Patient Education  2024 [employer].

## 2023-11-29 NOTE — Progress Notes (Signed)
 Subjective:   Jack Kent is a 78 y.o. who presents for a Medicare Wellness preventive visit.  As a reminder, Annual Wellness Visits don't include a physical exam, and some assessments may be limited, especially if this visit is performed virtually. We may recommend an in-person follow-up visit with your provider if needed.  Visit Complete: Virtual I connected with  Jack Kent on 11/29/23 by a audio enabled telemedicine application and verified that I am speaking with the correct person using two identifiers.  Patient Location: Home  Provider Location: Home Office  I discussed the limitations of evaluation and management by telemedicine. The patient expressed understanding and agreed to proceed.  Vital Signs: Because this visit was a virtual/telehealth visit, some criteria may be missing or patient reported. Any vitals not documented were not able to be obtained and vitals that have been documented are patient reported.  VideoDeclined- This patient declined Librarian, academic. Therefore the visit was completed with audio only.  Persons Participating in Visit: Patient.  AWV Questionnaire: No: Patient Medicare AWV questionnaire was not completed prior to this visit.  Cardiac Risk Factors include: advanced age (>34men, >43 women);male gender;dyslipidemia;hypertension     Objective:    Today's Vitals   11/29/23 1530  Weight: 151 lb (68.5 kg)  Height: 5' 4 (1.626 m)   Body mass index is 25.92 kg/m.     11/29/2023    3:43 PM 11/08/2022    2:21 PM 12/07/2021    8:12 AM 11/23/2021   11:32 AM  Advanced Directives  Does Patient Have a Medical Advance Directive? No No No No  Would patient like information on creating a medical advance directive? Yes (MAU/Ambulatory/Procedural Areas - Information given) No - Patient declined  No - Patient declined    Current Medications (verified) Outpatient Encounter Medications as of 11/29/2023  Medication  Sig   acetaminophen (TYLENOL) 500 MG tablet Take 500 mg by mouth every 6 (six) hours as needed.   amLODipine  (NORVASC ) 10 MG tablet Take 1 tablet (10 mg total) by mouth daily.   hydrochlorothiazide  (HYDRODIURIL ) 12.5 MG tablet Take 1 tablet (12.5 mg total) by mouth daily.   meloxicam  (MOBIC ) 7.5 MG tablet Take 1 tablet (7.5 mg total) by mouth daily as needed for pain.   No facility-administered encounter medications on file as of 11/29/2023.    Allergies (verified) Declomycin [demeclocycline]   History: Past Medical History:  Diagnosis Date   Hypertension    Motion sickness    Boats   Past Surgical History:  Procedure Laterality Date   CATARACT EXTRACTION W/PHACO Right 11/23/2021   Procedure: CATARACT EXTRACTION PHACO AND INTRAOCULAR LENS PLACEMENT (IOC) RIGHT;  Surgeon: Jaye Fallow, MD;  Location: [facility];  Service: Ophthalmology;  Laterality: Right;  15.23 1:27.4   CATARACT EXTRACTION W/PHACO Left 12/07/2021   Procedure: CATARACT EXTRACTION PHACO AND INTRAOCULAR LENS PLACEMENT (IOC) LEFT 41.14 03:07.3;  Surgeon: Jaye Fallow, MD;  Location: [facility];  Service: Ophthalmology;  Laterality: Left;   Family History  Problem Relation Age of Onset   Dementia Mother    Hodgkin's lymphoma Father    Social History   Socioeconomic History   Marital status: Married    Spouse name: Patience   Number of children: 1   Years of education: Not on file   Highest education level: Not on file  Occupational History   Occupation: retired  Tobacco Use   Smoking status: Never   Smokeless tobacco: Former    Types:  Chew   Tobacco comments:    Smoked and chewed some in [employer] Use   Vaping status: Never Used  Substance and Sexual Activity   Alcohol use: Yes    Comment: 2-3 beers 1-2 times per week (weekends)   Drug use: Never   Sexual activity: Not Currently  Other Topics Concern   Not on file  Social History Narrative   Not on file    Social Drivers of Health   Financial Resource Strain: Low Risk  (11/29/2023)   Overall Financial Resource Strain (CARDIA)    Difficulty of Paying Living Expenses: Not hard at all  Food Insecurity: No Food Insecurity (11/29/2023)   Hunger Vital Sign    Worried About Running Out of Food in the Last Year: Never true    Ran Out of Food in the Last Year: Never true  Transportation Needs: No Transportation Needs (11/29/2023)   PRAPARE - Administrator, Civil Service (Medical): No    Lack of Transportation (Non-Medical): No  Physical Activity: Sufficiently Active (11/29/2023)   Exercise Vital Sign    Days of Exercise per Week: 4 days    Minutes of Exercise per Session: 60 min  Stress: No Stress Concern Present (11/29/2023)   [employer] of Occupational Health - Occupational Stress Questionnaire    Feeling of Stress: Not at all  Social Connections: Moderately Isolated (11/29/2023)   Social Connection and Isolation Panel    Frequency of Communication with Friends and Family: Three times a week    Frequency of Social Gatherings with Friends and Family: Three times a week    Attends Religious Services: Never    Active Member of Clubs or Organizations: No    Attends Banker Meetings: Never    Marital Status: Married    Tobacco Counseling Counseling given: Not Answered Tobacco comments: Smoked and chewed some in [employer]    Clinical Intake:  Pre-visit preparation completed: Yes  Pain : No/denies pain     BMI - recorded: 25.92 Nutritional Status: BMI 25 -29 Overweight Nutritional Risks: None Diabetes: No  No results found for: HGBA1C   How often do you need to have someone help you when you read instructions, pamphlets, or other written materials from your doctor or pharmacy?: 1 - Never  Interpreter Needed?: No  Information entered by :: Vina Ned, CMA   Activities of Daily Living     11/29/2023    3:33 PM  In your present state of  health, do you have any difficulty performing the following activities:  Hearing? 1  Comment wears hearing aids  Vision? 0  Difficulty concentrating or making decisions? 0  Walking or climbing stairs? 0  Dressing or bathing? 0  Doing errands, shopping? 0  Preparing Food and eating ? N  Using the Toilet? N  In the past six months, have you accidently leaked urine? N  Do you have problems with loss of bowel control? N  Managing your Medications? N  Managing your Finances? N  Housekeeping or managing your Housekeeping? N    Patient Care Team: Kotturi, Vinay K, MD as PCP - [facility] (Family Medicine) [facility], [facility] (Optometry) Alvia Selinda PARAS, MD as Consulting Physician (Sports Medicine)  I have updated your Care Teams any recent Medical Services you may have received from other providers in the past year.     Assessment:   This is a routine wellness examination for [place].  Hearing/Vision screen Hearing Screening -  Comments:: Wears hearing aids Vision Screening - Comments:: Gets routine eye exams, [facility], [place]   Goals Addressed             This Visit's Progress    Patient Stated       Eat healthier       Depression Screen     11/29/2023    3:41 PM 11/16/2023   10:12 AM 06/22/2023    1:35 PM 01/09/2023    1:27 PM 11/08/2022    2:19 PM 07/08/2022    1:46 PM 02/14/2022   11:01 AM  PHQ 2/9 Scores  PHQ - 2 Score 0 0 0 0 0 0 0  PHQ- 9 Score 1  0 0 0 0 0    Fall Risk     11/29/2023    3:45 PM 11/16/2023   10:11 AM 06/22/2023    1:34 PM 01/09/2023    1:27 PM 11/08/2022    2:22 PM  Fall Risk   Falls in the past year? 0 0 0 0 0  Number falls in past yr: 0 0 0 0 0  Injury with Fall? 0 0 0 0 0  Risk for fall due to : No Fall Risks No Fall Risks No Fall Risks No Fall Risks No Fall Risks  Follow up Falls evaluation completed Falls evaluation completed Falls evaluation completed Falls evaluation completed Falls prevention discussed;Falls evaluation  completed    MEDICARE RISK AT HOME:  Medicare Risk at Home Any stairs in or around the home?: Yes If so, are there any without handrails?: No Home free of loose throw rugs in walkways, pet beds, electrical cords, etc?: Yes Adequate lighting in your home to reduce risk of falls?: Yes Life alert?: No Use of a cane, walker or w/c?: No Grab bars in the bathroom?: No Shower chair or bench in shower?: No Elevated toilet seat or a handicapped toilet?: Yes  TIMED UP AND GO:  Was the test performed?  No  Cognitive Function: 6CIT completed        11/29/2023    3:47 PM 11/08/2022    2:24 PM 10/25/2021    3:18 PM  6CIT Screen  What Year? 0 points 0 points 0 points  What month? 0 points 0 points 0 points  What time? 0 points 0 points 0 points  Count back from 20 0 points 0 points 0 points  Months in reverse 0 points 0 points 0 points  Repeat phrase 0 points 2 points 0 points  Total Score 0 points 2 points 0 points    Immunizations Immunization History  Administered Date(s) Administered   Fluad Quad(high Dose 65+) 01/20/2022   Fluad Trivalent(High Dose 65+) 01/09/2023   PNEUMOCOCCAL CONJUGATE-20 02/14/2022    Screening Tests Health Maintenance  Topic Date Due   COVID-19 Vaccine (1) Never done   Hepatitis C Screening  Never done   Zoster Vaccines- Shingrix (1 of 2) Never done   Influenza Vaccine  10/20/2023   Medicare Annual Wellness (AWV)  11/28/2024   Pneumococcal Vaccine: 50+ Years  Completed   HPV VACCINES  Aged Out   Meningococcal B Vaccine  Aged Out   DTaP/Tdap/Td  Discontinued    Health Maintenance Items Addressed: See Nurse Notes at the end of this note  Additional Screening:  Vision Screening: Recommended annual ophthalmology exams for early detection of glaucoma and other disorders of the eye. Is the patient up to date with their annual eye exam?  Yes  Who is the  provider or what is the name of the office in which the patient attends annual eye exams? [facility]  Dental Screening: Recommended annual dental exams for proper oral hygiene  Community Resource Referral / Chronic Care Management: CRR required this visit?  No   CCM required this visit?  No   Plan:    I have personally reviewed and noted the following in the patient's chart:   Medical and social history Use of alcohol, tobacco or illicit drugs  Current medications and supplements including opioid prescriptions. Patient is not currently taking opioid prescriptions. Functional ability and status Nutritional status Physical activity Advanced directives List of other physicians Hospitalizations, surgeries, and ER visits in previous 12 months Vitals Screenings to include cognitive, depression, and falls Referrals and appointments  In addition, I have reviewed and discussed with patient certain preventive protocols, quality metrics, and best practice recommendations. A written personalized care plan for preventive services as well as general preventive health recommendations were provided to patient.   Vina Ned, CMA   11/29/2023   After Visit Summary: (Mail) Due to this being a telephonic visit, the after visit summary with patients personalized plan was offered to patient via mail   Notes:  Scheduled OV for 12/25/23 with new PCP Will get flu shot at OV on 12/25/23 Declined covid and shingles vaccines

## 2023-12-02 IMAGING — CR DG SHOULDER 2+V*L*
3 series · 3 of 3 positions shown · non-contrast
Comparison: None Available.

CLINICAL DATA: Pain

EXAM:
LEFT SHOULDER - 2+ VIEW

[shoulder grashey]
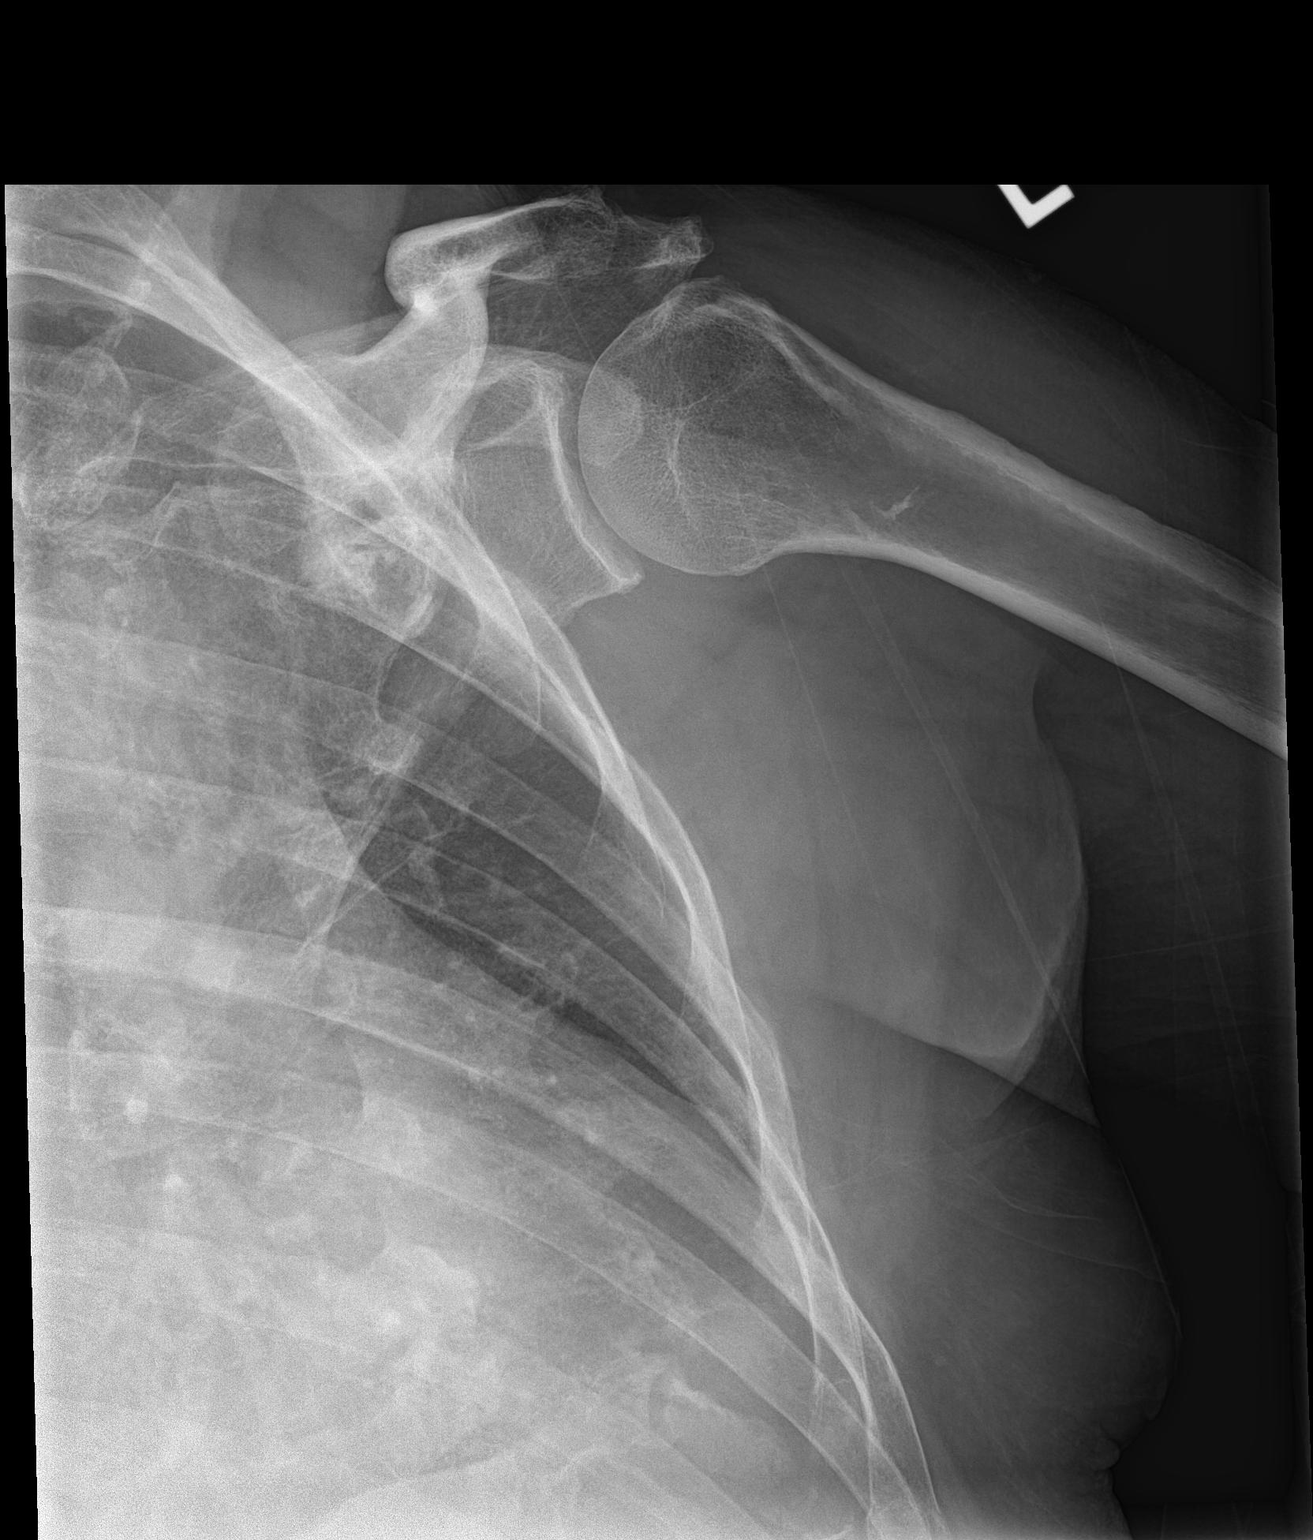

[shoulder y view]
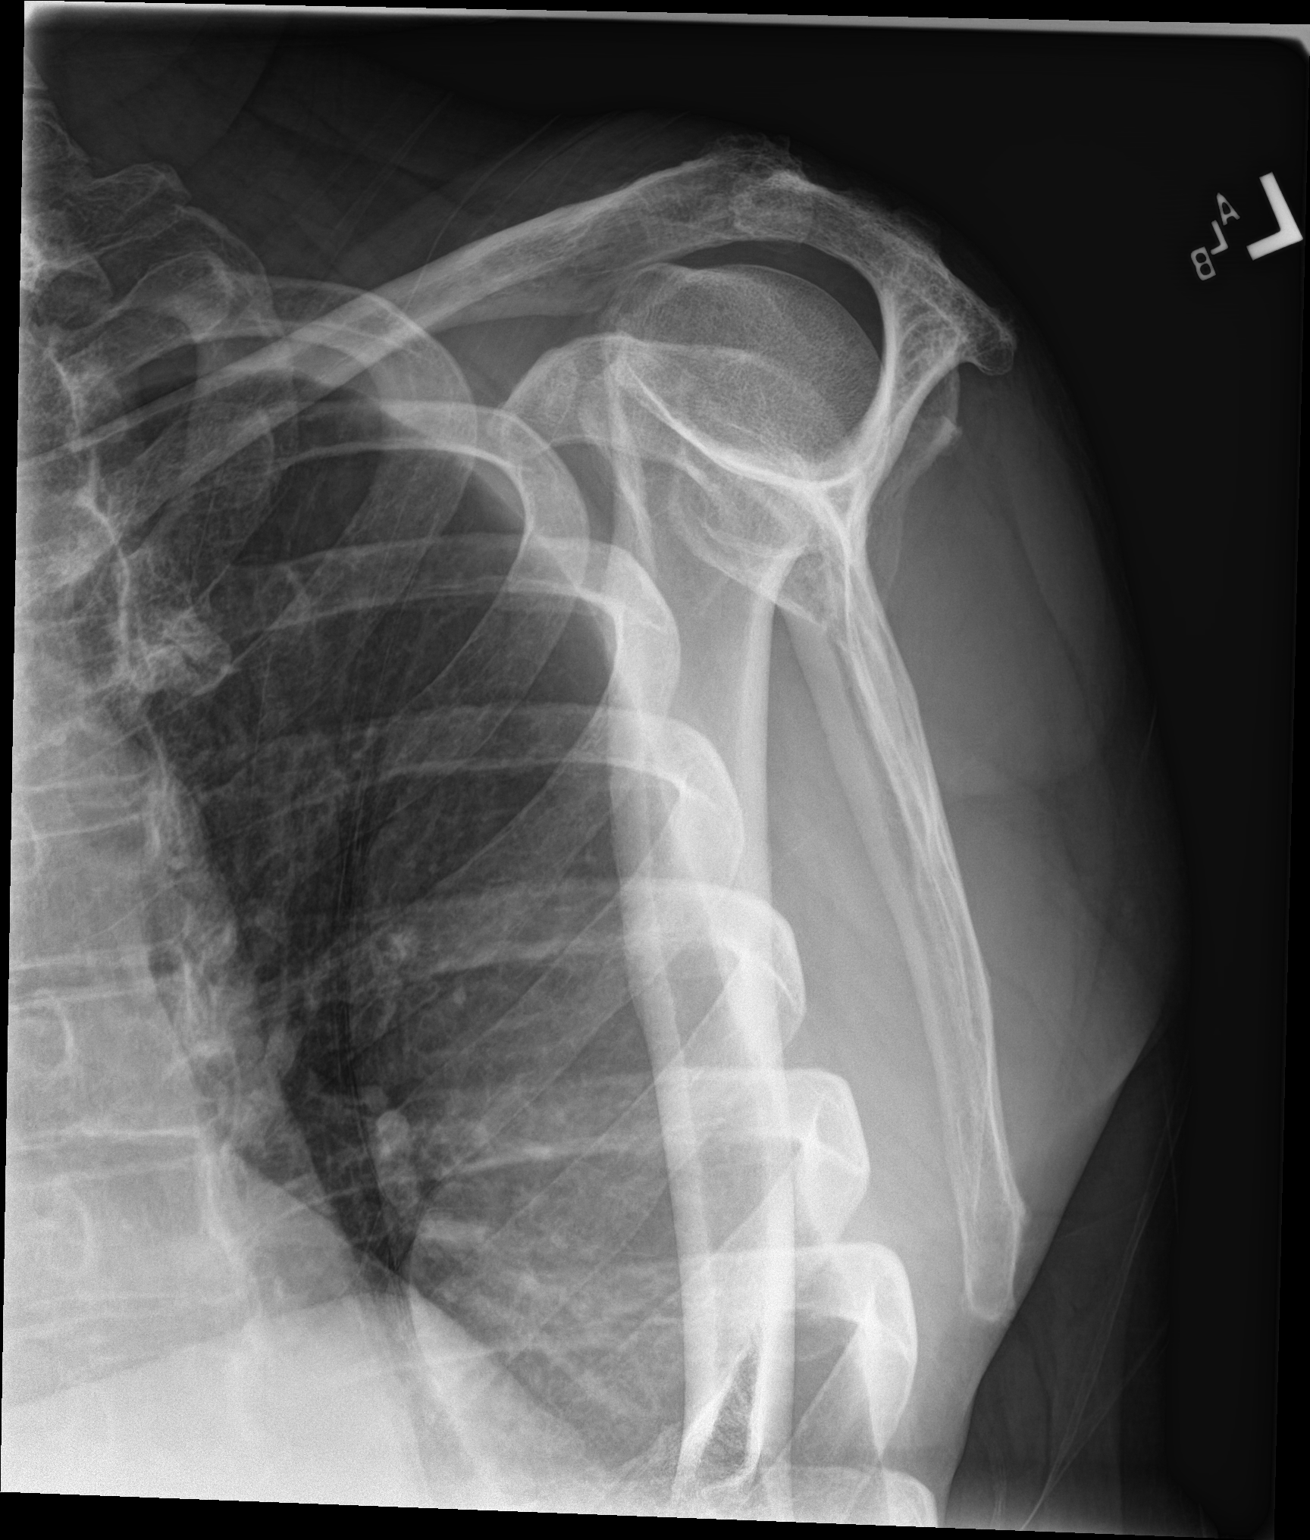

[shoulder axial]
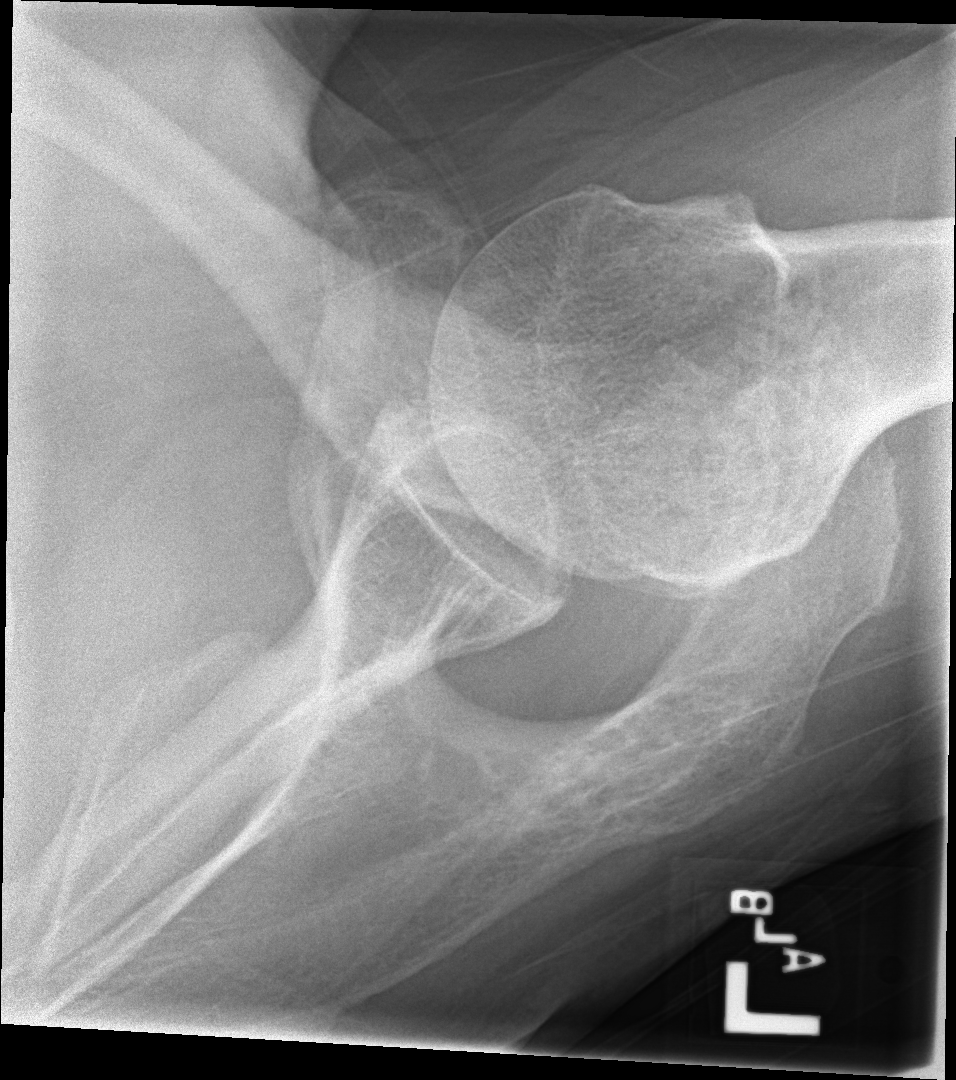

[3 of 3 positions shown; findings below may reference images not displayed]

FINDINGS: There is no evidence of fracture or dislocation. There is no
evidence of arthropathy or other focal bone abnormality. Soft
tissues are unremarkable.
IMPRESSION: Negative.

## 2023-12-18 ENCOUNTER — Ambulatory Visit (INDEPENDENT_AMBULATORY_CARE_PROVIDER_SITE_OTHER)

## 2023-12-18 DIAGNOSIS — Z23 Encounter for immunization: Secondary | ICD-10-CM | POA: Diagnosis not present

## 2023-12-18 NOTE — Progress Notes (Signed)
 Patient presents today for flu vaccine. Flu vaccine administered into right deltoid. Patient tolerated well.   JM

## 2023-12-21 ENCOUNTER — Other Ambulatory Visit: Payer: Self-pay

## 2023-12-21 DIAGNOSIS — I10 Essential (primary) hypertension: Secondary | ICD-10-CM

## 2023-12-21 MED ORDER — AMLODIPINE BESYLATE 10 MG PO TABS: 10.0000 mg | ORAL_TABLET | Freq: Every day | ORAL | 0 refills | Status: DC

## 2023-12-21 MED ORDER — HYDROCHLOROTHIAZIDE 12.5 MG PO TABS: 12.5000 mg | ORAL_TABLET | Freq: Every day | ORAL | 0 refills | Status: DC

## 2023-12-25 ENCOUNTER — Ambulatory Visit (INDEPENDENT_AMBULATORY_CARE_PROVIDER_SITE_OTHER): Admitting: Family Medicine

## 2023-12-25 ENCOUNTER — Ambulatory Visit
Admission: RE | Admit: 2023-12-25 | Discharge: 2023-12-25 | Disposition: A | Discharge status: Home / Self Care | Attending: Family Medicine | Admitting: Family Medicine

## 2023-12-25 ENCOUNTER — Ambulatory Visit
Admission: RE | Admit: 2023-12-25 | Discharge: 2023-12-25 | Disposition: A | Discharge status: Home / Self Care | Source: Ambulatory Visit | Attending: Family Medicine | Admitting: Family Medicine

## 2023-12-25 ENCOUNTER — Encounter: Payer: Self-pay | Admitting: Family Medicine

## 2023-12-25 VITALS — BP 118/68 | HR 91 | Ht 64.0 in | Wt 141.5 lb

## 2023-12-25 DIAGNOSIS — E785 Hyperlipidemia, unspecified: Secondary | ICD-10-CM | POA: Diagnosis not present

## 2023-12-25 DIAGNOSIS — Z130 Encounter for screening for diseases of the blood and blood-forming organs and certain disorders involving the immune mechanism: Secondary | ICD-10-CM

## 2023-12-25 DIAGNOSIS — E876 Hypokalemia: Secondary | ICD-10-CM | POA: Diagnosis not present

## 2023-12-25 DIAGNOSIS — Z131 Encounter for screening for diabetes mellitus: Secondary | ICD-10-CM | POA: Diagnosis not present

## 2023-12-25 DIAGNOSIS — R2241 Localized swelling, mass and lump, right lower limb: Secondary | ICD-10-CM

## 2023-12-25 DIAGNOSIS — I10 Essential (primary) hypertension: Secondary | ICD-10-CM | POA: Diagnosis not present

## 2023-12-25 MED ORDER — AMLODIPINE BESYLATE 10 MG PO TABS: 10.0000 mg | ORAL_TABLET | Freq: Every day | ORAL | 1 refills | Status: AC

## 2023-12-25 MED ORDER — HYDROCHLOROTHIAZIDE 12.5 MG PO TABS: 12.5000 mg | ORAL_TABLET | Freq: Every day | ORAL | 1 refills | Status: AC

## 2023-12-25 NOTE — Progress Notes (Signed)
 Established Patient Office Visit  Subjective   Patient ID: Jack Kent, male    DOB: 1946/03/12  Age: 78 y.o. MRN: [id number]  Chief Complaint  Patient presents with  . Establish Care    Patient is here to establish care with new PCP  . Hypertension  . Foot Swelling    X 3 weeks, has noticed swelling on his right foot     Assessment & Plan:   Problem List Items Addressed This Visit       Cardiovascular and Mediastinum   Primary hypertension - Primary   Relevant Medications   amLODipine  (NORVASC ) 10 MG tablet   hydrochlorothiazide  (HYDRODIURIL ) 12.5 MG tablet   Other Relevant Orders   Comprehensive metabolic panel with GFR     Other   Hypokalemia   Dyslipidemia   Relevant Orders   Lipid panel   Other Visit Diagnoses       Diabetes mellitus screening       Relevant Orders   Hemoglobin A1c     Screening for iron deficiency anemia       Relevant Orders   CBC with Differential/Platelet     Localized swelling of right foot       Relevant Orders   DG Foot Complete Right     Assessment and Plan Assessment & Plan Right foot swelling and pain Likely due to wear and tear from climbing ladders. Differential includes soft tissue injury or exacerbation of previous injury. - Order x-ray of right foot to rule out fracture or other bony injury. - Advise walk-in for x-ray without appointment.  Fungal nail infection Severe nail condition. Plans to consult a podiatrist for management.  Essential hypertension Managed with hydrochlorothiazide  and amlodipine . Blood pressure well-controlled. - Refill hydrochlorothiazide  and amlodipine  for 90 days.  General Health Maintenance Received flu shot. Declined shingles vaccine despite reassurance of benefits.    No follow-ups on file.   HPI Discussed the use of AI scribe software for clinical note transcription with the patient, who gave verbal consent to proceed.  History of Present Illness Jack Kent is a 78  year old male who presents with foot pain and swelling.  He has been experiencing pain and swelling in his right foot for approximately three to four weeks, which began after repeatedly climbing up and down a ladder while installing a ceiling fan. Initially, the swelling was significant and extended to the top of his foot. He used ice and salt water for relief, which improved the pain but not the swelling.  The pain has mostly subsided, but the swelling persists, affecting his ability to walk without difficulty. He recently acquired a walker to assist with mobility. No recent falls. He has a history of a previous toe fracture, which he believes occurred years ago, and notes that the toe does not move.  He has fungal nail issues. His current medications include hydrochlorothiazide  and amlodipine  for blood pressure management, which he refills every three months through an automatic system at [facility].  He is a former Financial controller at a [employer] and lives with his wife. No history of smoking.     Review of Systems  All other systems reviewed and are negative.     Objective:     BP 118/68   Pulse 91   Ht 5' 4 (1.626 m)   Wt 141 lb 8 oz (64.2 kg)   SpO2 99%   BMI 24.29 kg/m     Physical Exam Vitals and nursing note  reviewed.  Constitutional:      Appearance: Normal appearance.  HENT:     Head: Normocephalic.     Right Ear: External ear normal.     Left Ear: External ear normal.  Eyes:     Conjunctiva/sclera: Conjunctivae normal.  Cardiovascular:     Rate and Rhythm: Normal rate.  Pulmonary:     Effort: Pulmonary effort is normal. No respiratory distress.  Abdominal:     Palpations: Abdomen is soft.  Musculoskeletal:        General: Normal range of motion.  Skin:    General: Skin is warm.  Neurological:     Mental Status: He is alert and oriented to person, place, and time.  Psychiatric:        Mood and Affect: Mood normal.      No results found for any visits on  12/25/23.     The 10-year ASCVD risk score (Arnett DK, et al., 2019) is: 31.9%      Vinary K Rivan Siordia, MD

## 2023-12-26 ENCOUNTER — Ambulatory Visit: Payer: Self-pay | Admitting: Family Medicine

## 2023-12-26 LAB — COMPREHENSIVE METABOLIC PANEL WITH GFR
ALT: 13 IU/L (ref 0–44)
AST: 10 IU/L (ref 0–40)
Albumin: 3.6 g/dL — ABNORMAL LOW (ref 3.8–4.8)
Alkaline Phosphatase: 134 IU/L — ABNORMAL HIGH (ref 47–123)
BUN/Creatinine Ratio: 20 (ref 10–24)
BUN: 22 mg/dL (ref 8–27)
Bilirubin Total: 0.7 mg/dL (ref 0.0–1.2)
CO2: 19 mmol/L — ABNORMAL LOW (ref 20–29)
Calcium: 8.6 mg/dL (ref 8.6–10.2)
Chloride: 98 mmol/L (ref 96–106)
Creatinine, Ser: 1.12 mg/dL (ref 0.76–1.27)
Globulin, Total: 3.9 g/dL (ref 1.5–4.5)
Glucose: 108 mg/dL — ABNORMAL HIGH (ref 70–99)
Potassium: 3.1 mmol/L — ABNORMAL LOW (ref 3.5–5.2)
Sodium: 136 mmol/L (ref 134–144)
Total Protein: 7.5 g/dL (ref 6.0–8.5)
eGFR: 68 mL/min/1.73 (ref >59)

## 2023-12-26 LAB — CBC WITH DIFFERENTIAL/PLATELET
Basophils Absolute: 0 x10E3/uL (ref 0.0–0.2)
Basos: 0 %
EOS (ABSOLUTE): 0 x10E3/uL (ref 0.0–0.4)
Eos: 1 %
Hematocrit: 34.3 % — ABNORMAL LOW (ref 37.5–51.0)
Hemoglobin: 11.2 g/dL — ABNORMAL LOW (ref 13.0–17.7)
Immature Grans (Abs): 0.1 x10E3/uL (ref 0.0–0.1)
Immature Granulocytes: 1 %
Lymphocytes Absolute: 1 x10E3/uL (ref 0.7–3.1)
Lymphs: 16 %
MCH: 26.5 pg — ABNORMAL LOW (ref 26.6–33.0)
MCHC: 32.7 g/dL (ref 31.5–35.7)
MCV: 81 fL (ref 79–97)
Monocytes Absolute: 1.3 x10E3/uL — ABNORMAL HIGH (ref 0.1–0.9)
Monocytes: 21 %
Neutrophils Absolute: 4 x10E3/uL (ref 1.4–7.0)
Neutrophils: 61 %
Platelets: 248 x10E3/uL (ref 150–450)
RBC: 4.22 x10E6/uL (ref 4.14–5.80)
RDW: 14.7 % (ref 11.6–15.4)
WBC: 6.4 x10E3/uL (ref 3.4–10.8)

## 2023-12-26 LAB — LIPID PANEL
Chol/HDL Ratio: 5.4 ratio — ABNORMAL HIGH (ref 0.0–5.0)
Cholesterol, Total: 204 mg/dL — ABNORMAL HIGH (ref 100–199)
HDL: 38 mg/dL — ABNORMAL LOW (ref >39)
LDL Chol Calc (NIH): 149 mg/dL — ABNORMAL HIGH (ref 0–99)
Triglycerides: 91 mg/dL (ref 0–149)
VLDL Cholesterol Cal: 17 mg/dL (ref 5–40)

## 2023-12-26 LAB — HEMOGLOBIN A1C
Est. average glucose Bld gHb Est-mCnc: 108 mg/dL
Hgb A1c MFr Bld: 5.4 % (ref 4.8–5.6)

## 2023-12-29 ENCOUNTER — Other Ambulatory Visit: Payer: Self-pay | Admitting: Family Medicine

## 2023-12-29 DIAGNOSIS — M795 Residual foreign body in soft tissue: Secondary | ICD-10-CM

## 2023-12-29 DIAGNOSIS — R2241 Localized swelling, mass and lump, right lower limb: Secondary | ICD-10-CM

## 2023-12-29 NOTE — Progress Notes (Signed)
 Your foot xray shows possible foreign body, no fracture seen. Recommend seeing foot doctor and also please stop by office for tetanus shot.  Dr.K

## 2023-12-29 NOTE — Telephone Encounter (Signed)
 Copied from CRM [id number]. Topic: Clinical - Lab/Test Results >> Dec 29, 2023  2:26 PM Mia F wrote: Reason for CRM: Pt is calling Dr Sol as requested regarding recent test results. Please call pt back

## 2023-12-29 NOTE — Progress Notes (Signed)
 podi

## 2023-12-29 NOTE — Telephone Encounter (Signed)
 Patient called back to schedule nurse visit for Tdap. Appointment has been made.

## 2024-01-02 ENCOUNTER — Ambulatory Visit (INDEPENDENT_AMBULATORY_CARE_PROVIDER_SITE_OTHER)

## 2024-01-02 DIAGNOSIS — Z23 Encounter for immunization: Secondary | ICD-10-CM | POA: Diagnosis not present

## 2024-01-02 NOTE — Progress Notes (Signed)
 Patient presents today for his Tdap vaccine. Tdap administered into right deltoid. Patient tolerated well.  JM

## 2024-02-19 ENCOUNTER — Other Ambulatory Visit (INDEPENDENT_AMBULATORY_CARE_PROVIDER_SITE_OTHER): Payer: Self-pay | Admitting: Radiology

## 2024-02-19 ENCOUNTER — Ambulatory Visit: Admitting: Family Medicine

## 2024-02-19 ENCOUNTER — Encounter: Payer: Self-pay | Admitting: Family Medicine

## 2024-02-19 VITALS — BP 114/60 | HR 92 | Ht 64.0 in | Wt 145.0 lb

## 2024-02-19 DIAGNOSIS — M7582 Other shoulder lesions, left shoulder: Secondary | ICD-10-CM

## 2024-02-19 DIAGNOSIS — M7521 Bicipital tendinitis, right shoulder: Secondary | ICD-10-CM | POA: Diagnosis not present

## 2024-02-19 DIAGNOSIS — M7522 Bicipital tendinitis, left shoulder: Secondary | ICD-10-CM

## 2024-02-19 DIAGNOSIS — M7581 Other shoulder lesions, right shoulder: Secondary | ICD-10-CM

## 2024-02-19 DIAGNOSIS — M65332 Trigger finger, left middle finger: Secondary | ICD-10-CM

## 2024-02-19 DIAGNOSIS — M79645 Pain in left finger(s): Secondary | ICD-10-CM

## 2024-02-19 DIAGNOSIS — S63592A Other specified sprain of left wrist, initial encounter: Secondary | ICD-10-CM

## 2024-02-19 MED ORDER — METHYLPREDNISOLONE ACETATE 40 MG/ML IJ SUSP
160.0000 mg | Freq: Once | INTRAMUSCULAR | Status: AC
  Administered 2024-02-19: 160 mg via INTRA_ARTICULAR

## 2024-02-19 NOTE — Patient Instructions (Signed)
 VISIT SUMMARY:  During your visit, we addressed your bilateral shoulder pain, left hand trigger finger, left wrist pain, and left thumb pain. We provided treatments and recommendations to help manage your symptoms and improve your function.  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 -We gave you corticosteroid injections in both shoulders. -Take meloxicam  daily until the cortisone starts working. -Ice your shoulders for 20 minutes, especially before bedtime. -Reduce your activity for two days after the injections.  LEFT THIRD DIGIT TRIGGER FINGER (STENOSING TENOSYNOVITIS): Your left middle finger is locking and painful due to trigger finger. -We gave you a corticosteroid injection in your left middle finger. -Do the recommended exercises to prevent recurrence once your symptoms improve.  LEFT WRIST SPRAIN WITH TFCC IRRITATION: You have pain and tenderness in your left wrist, likely due to a sprain and TFCC irritation. -Ice your wrist for 20 minutes, especially before bedtime. -Monitor your symptoms and call us  if there is no improvement in two weeks.  LEFT THUMB PAIN: You have pain and swelling in your left thumb, possibly due to compensatory movements from your shoulder issues. -Monitor your thumb pain and swelling. -Call us  if your symptoms do not improve in two weeks.

## 2024-02-19 NOTE — Progress Notes (Signed)
 Primary Care / Sports Medicine Office Visit  Patient Information:  Patient ID: Jack Kent, male DOB: 12-17-1945 Age: 78 y.o. MRN: [id number]   [name] is a pleasant 78 y.o. male presenting with the following:  Chief Complaint  Patient presents with   Shoulder Pain    Bil shoulder pain x 3 months. Having difficulty raising and lifting arms. Pain will radiate down to bicep area. He has taken Tylenol for the pain which helps some. He would like to discuss treatment options such as injection therapy.    Hand Pain    Left hand pain at middle finger and on the lateral aspect of hand. Hand pain x 3 months. Middle finger is locking up on him. He would like to discuss getting a trigger finger injection.     Vitals:   02/19/24 1036  BP: 114/60  Pulse: 92  SpO2: 98%   Vitals:   02/19/24 1036  Weight: 145 lb (65.8 kg)  Height: 5' 4 (1.626 m)   Body mass index is 24.89 kg/m.  No results found.   Discussed the use of AI scribe software for clinical note transcription with the patient, who gave verbal consent to proceed.   Independent interpretation of notes and tests performed by another provider:   None  Procedures performed:   Procedure:  Injection of right shoulder under ultrasound guidance. Ultrasound guidance utilized for in-plane approach to right subacromial space Samsung HS60 device utilized with permanent recording / reporting. Verbal informed consent obtained and verified. Skin prepped in a sterile fashion. Ethyl chloride for topical local analgesia.  Completed without difficulty and tolerated well. Medication: triamcinolone  acetonide 40 mg/mL suspension for injection 1 mL total and 2 mL lidocaine  1% without epinephrine  utilized for needle placement anesthetic Advised to contact for fevers/chills, erythema, induration, drainage, or persistent bleeding.  Procedure:  Injection of right shoulder under ultrasound guidance. Ultrasound guidance utilized  for out of plane approach to right biceps tendon sheath, fluid noted sonographically Samsung HS60 device utilized with permanent recording / reporting. Verbal informed consent obtained and verified. Skin prepped in a sterile fashion. Ethyl chloride for topical local analgesia.  Completed without difficulty and tolerated well. Medication: triamcinolone  acetonide 40 mg/mL suspension for injection 1 mL total and 2 mL lidocaine  1% without epinephrine  utilized for needle placement anesthetic Advised to contact for fevers/chills, erythema, induration, drainage, or persistent bleeding.  Procedure:  Injection of left shoulder under ultrasound guidance. Ultrasound guidance utilized for in-plane approach to left subacromial space, no discrete sonographic evidence of tendon disruption noted Samsung HS60 device utilized with permanent recording / reporting. Verbal informed consent obtained and verified. Skin prepped in a sterile fashion. Ethyl chloride for topical local analgesia.  Completed without difficulty and tolerated well. Medication: triamcinolone  acetonide 40 mg/mL suspension for injection 1 mL total and 2 mL lidocaine  1% without epinephrine  utilized for needle placement anesthetic Advised to contact for fevers/chills, erythema, induration, drainage, or persistent bleeding.  Procedure:  Injection of left shoulder under ultrasound guidance. Ultrasound guidance utilized for approach to biceps tendon sheath, peritendinous hypoechogenicity consistent with tendinopathy Samsung HS60 device utilized with permanent recording / reporting. Verbal informed consent obtained and verified. Skin prepped in a sterile fashion. Ethyl chloride for topical local analgesia.  Completed without difficulty and tolerated well. Medication: triamcinolone  acetonide 40 mg/mL suspension for injection 1 mL total and 2 mL lidocaine  1% without epinephrine  utilized for needle placement anesthetic Advised to contact for  fevers/chills, erythema, induration, drainage, or persistent  bleeding.  Procedure:  Injection of left hand under ultrasound guidance. Ultrasound guidance utilized for in-plane approach to flexor tendon sheath of the third digit, sonographic evidence of tendon thickening noted at the site of maximal discomfort just distal to the distal palmar crease Samsung HS60 device utilized with permanent recording / reporting. Verbal informed consent obtained and verified. Skin prepped in a sterile fashion. Ethyl chloride for topical local analgesia.  Completed without difficulty and tolerated well. Medication: triamcinolone  acetonide 40 mg/mL suspension for injection 0.25 mL total and 0.5 mL lidocaine  1% without epinephrine  utilized for needle placement anesthetic Advised to contact for fevers/chills, erythema, induration, drainage, or persistent bleeding.  Pertinent History, Exam, Impression, and Recommendations:   History of Present Illness Jack Kent is a 78 year old male who presents with bilateral shoulder pain and left hand trigger finger.  Bilateral shoulder pain and functional limitation - Bilateral shoulder pain characterized by soreness and difficulty lifting arms - Pain is similar to prior episode in November 2023 - Left shoulder is particularly sore - Pain occurs during certain movements - Difficulty with arm elevation  Left hand trigger finger and grip weakness - Left middle finger exhibits triggering and locking - Decreased grip strength in both hands, most pronounced in the left middle finger - Prior trigger finger injection in right hand was effective - Seeking similar treatment for left hand  Left wrist pain - Localized pain and soreness at a specific spot in the left wrist  Medication use and preferences - Not currently taking medication for musculoskeletal symptoms - Has meloxicam  at home but has not used it recently - Prefers to avoid medication unless  necessary  Physical Exam BILATERAL SHOULDERS INSPECTION: No deformity, erythema, or swelling noted in either shoulder. Normal alignment bilaterally. PALPATION: Right biceps tendon non-tender. Left shoulder biciptal groove tender. RANGE OF MOTION: Full forward flexion, abduction, external rotation, and internal rotation bilaterally with discomfort during supraspinatus activation. STRENGTH: Painful resisted supraspinatus testing bilaterally with preserved 5/5 strength. External and internal rotation strength intact. NEUROLOGICAL: Sensation intact to light touch in bilateral upper extremities; no focal motor deficits. SPECIAL TESTS: Positive Neer's and Hawkins tests on the right. Positive empty can test bilaterally. Speed's and Yergason's test positive bilaterally.  LEFT HAND AND WRIST INSPECTION: Swelling present at the volar aspect of the third digit at the distal palmar crease. No deformity or ecchymosis. PALPATION: Tenderness at the triangular fibrocartilage complex (TFCC) of the left wrist. Tenderness at the volar third digit distal palmar crease. No tenderness along the metacarpals or proximal phalanges. RANGE OF MOTION: Full finger flexion and extension with discomfort at the third digit during grip, no active triggering. Wrist flexion, extension, and radial-ulnar deviation preserved with mild pain at end range. STRENGTH: Grip strength mildly reduced on the left due to pain; otherwise 5/5 in wrist flexion and extension. NEUROVASCULAR: Sensation intact to light touch in all digits; brisk capillary refill. SPECIAL TESTS: Findings consistent with left wrist sprain; no instability with ulnar or radial deviation stress.  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 Painful resisted movements suggest biceps involvement. Symptoms  likely exacerbated by compensatory movements. - Administered corticosteroid injections to both shoulders. - Advised meloxicam  daily until cortisone effect is evident. - Recommended icing shoulders for 20 minutes, repeating before bedtime. - Advised reduced activity for two days post-injection.  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 Previous successful treatment on right hand. - Administered corticosteroid injection to the left third digit. - Advised on exercises to prevent recurrence once symptoms improve.  Left wrist sprain with TFCC irritation Pain localized to TFCC area with swelling and tenderness, consistent with TFCC irritation, possibly due to compensatory movements. - Advised icing the wrist for 20 minutes, repeating before bedtime. - Instructed to monitor symptoms and call if no improvement in two weeks.  Left thumb pain Pain and swelling possibly related to compensatory movements due to shoulder issues. - Monitor thumb pain and swelling. - Advised to call if symptoms do not improve in two weeks.  Problem List Items Addressed This Visit     Biceps tendinitis of left shoulder   Relevant Orders   US  LIMITED JOINT SPACE STRUCTURES UP BILAT   Biceps tendinitis of right shoulder   Relevant Orders   US  LIMITED JOINT SPACE STRUCTURES UP BILAT   Pain of left thumb   Rotator cuff tendinitis, left - Primary   Relevant Orders   US  LIMITED JOINT SPACE STRUCTURES UP BILAT   Rotator cuff tendinitis, right   Relevant Orders   US  LIMITED JOINT SPACE STRUCTURES UP BILAT   Sprain of triangular fibrocartilage of left wrist   Other Visit Diagnoses       Trigger finger, left middle finger       Relevant Medications   methylPREDNISolone acetate (DEPO-MEDROL) injection 160 mg (Completed)   Other Relevant Orders   US  LIMITED JOINT SPACE STRUCTURES UP BILAT        Orders &  Medications Medications:  Meds ordered this encounter  Medications   methylPREDNISolone acetate (DEPO-MEDROL) injection 160 mg   Orders Placed This Encounter  Procedures   US  LIMITED JOINT SPACE STRUCTURES UP BILAT     No follow-ups on file.     Selinda JINNY Ku, MD, [facility]   Primary Care Sports Medicine Primary Care and Sports Medicine at [facility]

## 2024-06-24 ENCOUNTER — Ambulatory Visit: Admitting: Family Medicine

## 2024-12-04 ENCOUNTER — Ambulatory Visit
